# Patient Record
Sex: Female | Born: 1975 | Race: Black or African American | Hispanic: No | Marital: Married | Smoking: Never smoker
Health system: Southern US, Community
[De-identification: ages and names within clinical notes are randomized; demographics above are authoritative.]

## PROBLEM LIST (undated history)

## (undated) DIAGNOSIS — A6000 Herpesviral infection of urogenital system, unspecified: Secondary | ICD-10-CM

## (undated) HISTORY — PX: WISDOM TOOTH EXTRACTION: SHX21

---
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
## 1999-07-26 ENCOUNTER — Inpatient Hospital Stay (HOSPITAL_COMMUNITY): Admission: AD | Admit: 1999-07-26 | Discharge: 1999-07-26 | Payer: Self-pay | Admitting: *Deleted

## 1999-07-26 ENCOUNTER — Encounter: Payer: Self-pay | Admitting: *Deleted

## 2012-06-23 HISTORY — PX: IUD REMOVAL: SHX5392

## 2014-05-23 DIAGNOSIS — A6000 Herpesviral infection of urogenital system, unspecified: Secondary | ICD-10-CM

## 2014-05-23 HISTORY — DX: Herpesviral infection of urogenital system, unspecified: A60.00

## 2016-07-29 DIAGNOSIS — Z01419 Encounter for gynecological examination (general) (routine) without abnormal findings: Secondary | ICD-10-CM | POA: Diagnosis not present

## 2017-11-18 DIAGNOSIS — K0889 Other specified disorders of teeth and supporting structures: Secondary | ICD-10-CM | POA: Diagnosis not present

## 2017-11-18 DIAGNOSIS — K047 Periapical abscess without sinus: Secondary | ICD-10-CM | POA: Diagnosis not present

## 2018-01-04 ENCOUNTER — Telehealth: Payer: Self-pay | Admitting: Obstetrics & Gynecology

## 2018-01-04 ENCOUNTER — Encounter: Payer: Self-pay | Admitting: Obstetrics & Gynecology

## 2018-01-04 ENCOUNTER — Ambulatory Visit (INDEPENDENT_AMBULATORY_CARE_PROVIDER_SITE_OTHER): Payer: BLUE CROSS/BLUE SHIELD | Admitting: Obstetrics & Gynecology

## 2018-01-04 ENCOUNTER — Other Ambulatory Visit (HOSPITAL_COMMUNITY)
Admission: RE | Admit: 2018-01-04 | Discharge: 2018-01-04 | Disposition: A | Discharge status: Home / Self Care | Payer: BLUE CROSS/BLUE SHIELD | Source: Ambulatory Visit | Attending: Obstetrics & Gynecology | Admitting: Obstetrics & Gynecology

## 2018-01-04 VITALS — BP 118/74 | HR 80 | Ht 67.0 in | Wt 220.0 lb

## 2018-01-04 DIAGNOSIS — Z124 Encounter for screening for malignant neoplasm of cervix: Secondary | ICD-10-CM

## 2018-01-04 DIAGNOSIS — Z0000 Encounter for general adult medical examination without abnormal findings: Secondary | ICD-10-CM | POA: Diagnosis not present

## 2018-01-04 DIAGNOSIS — Z1231 Encounter for screening mammogram for malignant neoplasm of breast: Secondary | ICD-10-CM | POA: Diagnosis not present

## 2018-01-04 DIAGNOSIS — Z30431 Encounter for routine checking of intrauterine contraceptive device: Secondary | ICD-10-CM | POA: Diagnosis not present

## 2018-01-04 DIAGNOSIS — Z1239 Encounter for other screening for malignant neoplasm of breast: Secondary | ICD-10-CM

## 2018-01-04 NOTE — Telephone Encounter (Signed)
7/26 AT 940 FOR MIRENA WITH [facility]

## 2018-01-04 NOTE — Progress Notes (Signed)
HPI:      Julia Graham is a 42 y.o. [phone] who LMP was Patient's last menstrual period was 12/30/2017 (exact date)., she presents today for her annual examination. The patient has no complaints today. Prior PAP, uncertain date. The patient is sexually active. Her no prior history of gyn screening tests. The patient does perform self breast exams.  There is no notable family history of breast or ovarian cancer in her family.  The patient has regular exercise: yes.  The patient denies current symptoms of depression.    GYN History: Contraception: IUD  PMHx: History reviewed. No pertinent past medical history. Past Surgical History:  Procedure Laterality Date  . IUD REMOVAL  2014  . WISDOM TOOTH EXTRACTION     History reviewed. No pertinent family history. Social History   Tobacco Use  . Smoking status: Never Smoker  . Smokeless tobacco: Never Used  Substance Use Topics  . Alcohol use: Never    Frequency: Never  . Drug use: Never    Current Outpatient Medications:  .  levonorgestrel (MIRENA) 20 MCG/24HR IUD, 1 each by Intrauterine route once., Disp: , Rfl:  Allergies: Patient has no known allergies.  Review of Systems  Constitutional: Negative for chills, fever and malaise/fatigue.  HENT: Negative for congestion, sinus pain and sore throat.   Eyes: Negative for blurred vision and pain.  Respiratory: Negative for cough and wheezing.   Cardiovascular: Negative for chest pain and leg swelling.  Gastrointestinal: Negative for abdominal pain, constipation, diarrhea, heartburn, nausea and vomiting.  Genitourinary: Negative for dysuria, frequency, hematuria and urgency.  Musculoskeletal: Negative for back pain, joint pain, myalgias and neck pain.  Skin: Negative for itching and rash.  Neurological: Negative for dizziness, tremors and weakness.  Endo/Heme/Allergies: Does not bruise/bleed easily.  Psychiatric/Behavioral: Negative for depression. The patient is not  nervous/anxious and does not have insomnia.    Objective: BP 118/74 (BP Location: Right Arm, Patient Position: Sitting, Cuff Size: Large)   Pulse 80   Ht 5\' 7"  (1.702 m)   Wt 220 lb (99.8 kg)   LMP 12/30/2017 (Exact Date)   BMI 34.46 kg/m   Filed Weights   01/04/18 0942  Weight: 220 lb (99.8 kg)   Body mass index is 34.46 kg/m. Physical Exam  Constitutional: She is oriented to person, place, and time. She appears well-developed and well-nourished. No distress.  Genitourinary: Rectum normal, vagina normal and uterus normal. Pelvic exam was performed with patient supine. There is no rash or lesion on the right labia. There is no rash or lesion on the left labia. Vagina exhibits no lesion. No bleeding in the vagina. Right adnexum does not display mass and does not display tenderness. Left adnexum does not display mass and does not display tenderness. Cervix does not exhibit motion tenderness, lesion, friability or polyp.   Uterus is mobile and midaxial. Uterus is not enlarged or exhibiting a mass.  Genitourinary Comments: IUD strings not seen or felt  HENT:  Head: Normocephalic and atraumatic. Head is without laceration.  Right Ear: Hearing normal.  Left Ear: Hearing normal.  Nose: No epistaxis.  No foreign bodies.  Mouth/Throat: Uvula is midline, oropharynx is clear and moist and mucous membranes are normal.  Eyes: Pupils are equal, round, and reactive to light.  Neck: Normal range of motion. Neck supple. No thyromegaly present.  Cardiovascular: Normal rate and regular rhythm. Exam reveals no gallop and no friction rub.  No murmur heard. Pulmonary/Chest: Effort normal and breath sounds  normal. No respiratory distress. She has no wheezes. Right breast exhibits no mass, no skin change and no tenderness. Left breast exhibits no mass, no skin change and no tenderness.  Abdominal: Soft. Bowel sounds are normal. She exhibits no distension. There is no tenderness. There is no rebound.    Musculoskeletal: Normal range of motion.  Neurological: She is alert and oriented to person, place, and time. No cranial nerve deficit.  Skin: Skin is warm and dry.  Psychiatric: She has a normal mood and affect. Judgment normal.  Vitals reviewed.  Assessment:  ANNUAL EXAM 1. Annual physical exam   2. Screening for cervical cancer   3. Screening for breast cancer    Screening Plan:            1.  Cervical Screening-  Pap smear done today  2. Breast screening- Exam annually and mammogram>40 planned   3. Colonoscopy every 10 years- after age 58  4. Labs managed by PCP  5. Counseling for contraception: IUD  Needs exchange soon [place] first as cannot see or feel string    F/U  Return in about 1 year (around 01/05/2019) for Annual.  Barnett Applebaum, MD, [facility], [facility] 01/04/2018  9:54 AM

## 2018-01-04 NOTE — Patient Instructions (Addendum)
PAP every three years Mammogram every year    Call [phone] to schedule at [facility] yearly (with PCP)  Levonorgestrel intrauterine device (IUD) What is this medicine? LEVONORGESTREL IUD (LEE voe nor jes trel) is a contraceptive (birth control) device. The device is placed inside the uterus by a healthcare professional. It is used to prevent pregnancy. This device can also be used to treat heavy bleeding that occurs during your period. This medicine may be used for other purposes; ask your health care provider or pharmacist if you have questions. COMMON BRAND NAME(S): Minette Headland What should I tell my health care provider before I take this medicine? They need to know if you have any of these conditions: -abnormal Pap smear -cancer of the breast, uterus, or cervix -diabetes -endometritis -genital or pelvic infection now or in the past -have more than one sexual partner or your partner has more than one partner -heart disease -history of an ectopic or tubal pregnancy -immune system problems -IUD in place -liver disease or tumor -problems with blood clots or take blood-thinners -seizures -use intravenous drugs -uterus of unusual shape -vaginal bleeding that has not been explained -an unusual or allergic reaction to levonorgestrel, other hormones, silicone, or polyethylene, medicines, foods, dyes, or preservatives -pregnant or trying to get pregnant -breast-feeding How should I use this medicine? This device is placed inside the uterus by a health care professional. Talk to your pediatrician regarding the use of this medicine in children. Special care may be needed. Overdosage: If you think you have taken too much of this medicine contact a poison control center or emergency room at once. NOTE: This medicine is only for you. Do not share this medicine with others. What if I miss a dose? This does not apply. Depending on the brand of device you have  inserted, the device will need to be replaced every 3 to 5 years if you wish to continue using this type of birth control. What may interact with this medicine? Do not take this medicine with any of the following medications: -amprenavir -bosentan -fosamprenavir This medicine may also interact with the following medications: -aprepitant -armodafinil -barbiturate medicines for inducing sleep or treating seizures -bexarotene -boceprevir -griseofulvin -medicines to treat seizures like carbamazepine, ethotoin, felbamate, oxcarbazepine, phenytoin, topiramate -modafinil -pioglitazone -rifabutin -rifampin -rifapentine -some medicines to treat HIV infection like atazanavir, efavirenz, indinavir, lopinavir, nelfinavir, tipranavir, ritonavir -St. John's wort -warfarin This list may not describe all possible interactions. Give your health care provider a list of all the medicines, herbs, non-prescription drugs, or dietary supplements you use. Also tell them if you smoke, drink alcohol, or use illegal drugs. Some items may interact with your medicine. What should I watch for while using this medicine? Visit your doctor or health care professional for regular check ups. See your doctor if you or your partner has sexual contact with others, becomes HIV positive, or gets a sexual transmitted disease. This product does not protect you against HIV infection (AIDS) or other sexually transmitted diseases. You can check the placement of the IUD yourself by reaching up to the top of your vagina with clean fingers to feel the threads. Do not pull on the threads. It is a good habit to check placement after each menstrual period. Call your doctor right away if you feel more of the IUD than just the threads or if you cannot feel the threads at all. The IUD may come out by itself. You may become pregnant if the device comes  out. If you notice that the IUD has come out use a backup birth control method like condoms  and call your health care provider. Using tampons will not change the position of the IUD and are okay to use during your period. This IUD can be safely scanned with magnetic resonance imaging (MRI) only under specific conditions. Before you have an MRI, tell your healthcare provider that you have an IUD in place, and which type of IUD you have in place. What side effects may I notice from receiving this medicine? Side effects that you should report to your doctor or health care professional as soon as possible: -allergic reactions like skin rash, itching or hives, swelling of the face, lips, or tongue -fever, flu-like symptoms -genital sores -high blood pressure -no menstrual period for 6 weeks during use -pain, swelling, warmth in the leg -pelvic pain or tenderness -severe or sudden headache -signs of pregnancy -stomach cramping -sudden shortness of breath -trouble with balance, talking, or walking -unusual vaginal bleeding, discharge -yellowing of the eyes or skin Side effects that usually do not require medical attention (report to your doctor or health care professional if they continue or are bothersome): -acne -breast pain -change in sex drive or performance -changes in weight -cramping, dizziness, or faintness while the device is being inserted -headache -irregular menstrual bleeding within first 3 to 6 months of use -nausea This list may not describe all possible side effects. Call your doctor for medical advice about side effects. You may report side effects to FDA at 1-800-FDA-1088. Where should I keep my medicine? This does not apply. NOTE: This sheet is a summary. It may not cover all possible information. If you have questions about this medicine, talk to your doctor, pharmacist, or health care provider.  2018 Elsevier/Gold Standard (2016-03-21 14:14:56)

## 2018-01-05 LAB — CYTOLOGY - PAP
Diagnosis: NEGATIVE
HPV: NOT DETECTED

## 2018-01-12 NOTE — Telephone Encounter (Signed)
Mirena reserved for this patient.

## 2018-01-15 ENCOUNTER — Ambulatory Visit (INDEPENDENT_AMBULATORY_CARE_PROVIDER_SITE_OTHER): Payer: BLUE CROSS/BLUE SHIELD

## 2018-01-15 ENCOUNTER — Encounter: Payer: Self-pay | Admitting: Obstetrics & Gynecology

## 2018-01-15 ENCOUNTER — Ambulatory Visit (INDEPENDENT_AMBULATORY_CARE_PROVIDER_SITE_OTHER): Payer: BLUE CROSS/BLUE SHIELD | Admitting: Obstetrics & Gynecology

## 2018-01-15 VITALS — BP 120/80 | Wt 219.0 lb

## 2018-01-15 DIAGNOSIS — D251 Intramural leiomyoma of uterus: Secondary | ICD-10-CM

## 2018-01-15 DIAGNOSIS — Z30433 Encounter for removal and reinsertion of intrauterine contraceptive device: Secondary | ICD-10-CM | POA: Diagnosis not present

## 2018-01-15 DIAGNOSIS — Z30431 Encounter for routine checking of intrauterine contraceptive device: Secondary | ICD-10-CM

## 2018-01-15 DIAGNOSIS — Z8742 Personal history of other diseases of the female genital tract: Secondary | ICD-10-CM

## 2018-01-15 NOTE — Progress Notes (Signed)
  History of Present Illness:  Julia Graham is a 42 y.o. that had a Mirena IUD placed approximately 5 years ago. Since that time, she states that she has improved menorrhagia w min-no periods.  The following portions of the patient's history were reviewed and updated as appropriate: allergies, current medications, past family history, past medical history, past social history, past surgical history and problem list.  There are no active problems to display for this patient.  Medications:  Current Outpatient Medications on File Prior to Visit  Medication Sig Dispense Refill  . levonorgestrel (MIRENA) 20 MCG/24HR IUD 1 each by Intrauterine route once.     No current facility-administered medications on file prior to visit.    Allergies: has No Known Allergies.  Physical Exam:  BP 120/80   Wt 219 lb (99.3 kg)   LMP 12/30/2017 (Exact Date)   BMI 34.30 kg/m  Body mass index is 34.3 kg/m. Constitutional: Well nourished, well developed female in no acute distress.  Abdomen: diffusely non tender to palpation, non distended, and no masses, hernias Neuro: Grossly intact Psych:  Normal mood and affect.    Pelvic exam:  Two IUD strings present seen coming from the cervical os. EGBUS, vaginal vault and cervix: within normal limits  IUD Removal Strings of IUD identified and grasped.  IUD removed without problem.  Pt tolerated this well.  IUD noted to be intact.  Assessment: IUD Removal and Re-insertion for Contraception and Menorrhagia control  IUD PROCEDURE NOTE:  Julia Graham is a 42 y.o. [phone] here for IUD insertion. No GYN concerns.  Last pap smear was normal.  IUD Insertion Procedure Note Patient identified, informed consent performed, consent signed.   Discussed risks of irregular bleeding, cramping, infection, malpositioning or misplacement of the IUD outside the uterus which may require further procedure such as laparoscopy, risk of failure <1%. Time out was  performed.  Urine pregnancy test negative.  A bimanual exam showed the uterus to be midposition.  Speculum placed in the vagina.  Cervix visualized.  Cleaned with Betadine x 2.  Grasped anteriorly with a single tooth tenaculum.  Uterus sounded to 7 cm.   IUD placed per manufacturer's recommendations.  Strings trimmed to 3 cm. Tenaculum was removed, good hemostasis noted.  Patient tolerated procedure well.   Patient was given post-procedure instructions.  She was advised to have backup contraception for one week.  Patient was also asked to check IUD strings periodically and follow up in 4 weeks for IUD check.  Barnett Applebaum, MD, [facility], [facility] 01/15/2018  10:29 AM

## 2018-02-12 ENCOUNTER — Encounter: Payer: Self-pay | Admitting: Obstetrics & Gynecology

## 2018-02-12 ENCOUNTER — Ambulatory Visit (INDEPENDENT_AMBULATORY_CARE_PROVIDER_SITE_OTHER): Payer: BLUE CROSS/BLUE SHIELD | Admitting: Obstetrics & Gynecology

## 2018-02-12 VITALS — BP 120/80 | Ht 67.0 in | Wt 203.0 lb

## 2018-02-12 DIAGNOSIS — Z30431 Encounter for routine checking of intrauterine contraceptive device: Secondary | ICD-10-CM

## 2018-02-12 NOTE — Progress Notes (Signed)
  History of Present Illness:  Julia Graham is a 42 y.o. that had a Mirena IUD placed approximately 4 weeks ago. Since that time, she states that she has had no discharge and pain. She had some brief BTB after placement, none now.  PMHx: She  has no past medical history on file. Also,  has a past surgical history that includes IUD removal (2014) and Wisdom tooth extraction., family history is not on file.,  reports that she has never smoked. She has never used smokeless tobacco. She reports that she does not drink alcohol or use drugs. No outpatient medications have been marked as taking for the 02/12/18 encounter (Office Visit) with Gae Dry, MD.  .  Also, has No Known Allergies..  Review of Systems  All other systems reviewed and are negative.  Physical Exam:  BP 120/80   Ht 5\' 7"  (1.702 m)   Wt 203 lb (92.1 kg)   LMP 01/25/2018   BMI 31.79 kg/m  Body mass index is 31.79 kg/m. Constitutional: Well nourished, well developed female in no acute distress.  Abdomen: diffusely non tender to palpation, non distended, and no masses, hernias Neuro: Grossly intact Psych:  Normal mood and affect.    Pelvic exam:  Two IUD strings present seen coming from the cervical os. EGBUS, vaginal vault and cervix: within normal limits  Assessment: IUD strings present in proper location; pt doing well  Plan: She was told to continue to use barrier contraception, in order to prevent any STIs, and to take a home pregnancy test or call us if she ever thinks she may be pregnant, and that her IUD expires in 5 years.  She was amenable to this plan and we will see her back in 1 year/PRN.  A total of 15 minutes were spent face-to-face with the patient during this encounter and over half of that time dealt with counseling and coordination of care.  Barnett Applebaum, MD, [facility], [facility] 02/12/2018  10:03 AM

## 2018-04-14 ENCOUNTER — Telehealth: Payer: Self-pay

## 2018-04-14 NOTE — Telephone Encounter (Signed)
Left message to remind pt to get mammogram

## 2018-04-14 NOTE — Telephone Encounter (Signed)
-----   Message from Gae Dry, MD sent at 04/13/2018  7:47 AM EDT ----- Regarding: MMG Received notice she has not received MMG yet as ordered at her Annual. Please check and encourage her to do this, and document conversation.

## 2018-04-27 DIAGNOSIS — B373 Candidiasis of vulva and vagina: Secondary | ICD-10-CM | POA: Diagnosis not present

## 2019-03-01 ENCOUNTER — Ambulatory Visit (INDEPENDENT_AMBULATORY_CARE_PROVIDER_SITE_OTHER): Payer: 59 | Admitting: Obstetrics and Gynecology

## 2019-03-01 ENCOUNTER — Other Ambulatory Visit: Payer: Self-pay

## 2019-03-01 ENCOUNTER — Encounter: Payer: Self-pay | Admitting: Obstetrics and Gynecology

## 2019-03-01 VITALS — BP 120/70 | Ht 67.5 in | Wt 227.0 lb

## 2019-03-01 DIAGNOSIS — A6004 Herpesviral vulvovaginitis: Secondary | ICD-10-CM | POA: Diagnosis not present

## 2019-03-01 MED ORDER — VALACYCLOVIR HCL 500 MG PO TABS: 500.0000 mg | ORAL_TABLET | Freq: Two times a day (BID) | ORAL | 0 refills | Status: AC

## 2019-03-01 NOTE — Patient Instructions (Signed)
I value your feedback and entrusting us with your care. If you get a [facility] patient survey, I would appreciate you taking the time to let us know about your experience today. Thank you! 

## 2019-03-01 NOTE — Progress Notes (Signed)
Patient, No Pcp Per   Chief Complaint  Patient presents with  . Vaginal Exam    pt thinks she's having herpes outbreak    HPI:      [name] is a 43 y.o. [phone] who LMP was Patient's last menstrual period was 02/15/2019 (approximate)., presents today for vaginal lesions for several days. 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 No recurrent sx until this time. No vag d/c, odor. Treating with anti-itch cream. No new sex partners.   Mirena placed 01/15/18 Has annual 9/20  Past Medical History:  Diagnosis Date  . 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 IUD REMOVAL  2014  . WISDOM TOOTH EXTRACTION      History reviewed. No pertinent family history.  Social History   Socioeconomic History  . Marital status: Single    Spouse name: Not on file  . Number of children: Not on file  . Years of education: Not on file  . Highest education level: Not on file  Occupational History  . Not on file  Social Needs  . Financial resource strain: Not on file  . Food insecurity    Worry: Not on file    Inability: Not on file  . Transportation needs    Medical: Not on file    Non-medical: Not on file  Tobacco Use  . Smoking status: Never Smoker  . Smokeless tobacco: Never Used  Substance and Sexual Activity  . Alcohol use: Never    Frequency: Never  . Drug use: Never  . Sexual activity: Yes    Partners: Male    Birth control/protection: I.U.D.    Comment: Mirena  Lifestyle  . Physical activity    Days per week: 0 days    Minutes per session: 0 min  . Stress: Not on file  Relationships  . Social Herbalist on phone: Not on file    Gets together: Not on file    Attends religious service: Not on file    Active member of club or organization: Not on file    Attends meetings of clubs or organizations: Not on file    Relationship  status: Not on file  . Intimate partner violence    Fear of current or ex partner: Not on file    Emotionally abused: Not on file    Physically abused: Not on file    Forced sexual activity: Not on file  Other Topics Concern  . Not on file  Social History Narrative  . Not on file    Outpatient Medications Prior to Visit  Medication Sig Dispense Refill  . levonorgestrel (MIRENA) 20 MCG/24HR IUD 1 each by Intrauterine route once.     No facility-administered medications prior to visit.       ROS:  Review of Systems  Constitutional: Negative for fever.  Gastrointestinal: Negative for blood in stool, constipation, diarrhea, nausea and vomiting.  Genitourinary: Positive for genital sores. Negative for dyspareunia, dysuria, flank pain, frequency, hematuria, urgency, vaginal bleeding, vaginal discharge and vaginal pain.  Musculoskeletal: Negative for back pain.  Skin: Negative for rash.    OBJECTIVE:   Vitals:  BP 120/70   Ht 5' 7.5" (1.715 m)   Wt 227 lb (103 kg)   LMP 02/15/2019 (Approximate)   BMI 35.03 kg/m   Physical Exam Vitals  signs reviewed.  Constitutional:      Appearance: She is well-developed.  Neck:     Musculoskeletal: Normal range of motion.  Pulmonary:     Effort: Pulmonary effort is normal.  Genitourinary:    General: Normal vulva.     Pubic Area: No rash.      Labia:        Right: Lesion present. No rash or tenderness.        Left: Lesion present. No rash or tenderness.        Comments: MULT ULCERATED LESIONS BILAT LABIA MAJORA, R>L Musculoskeletal: Normal range of motion.  Skin:    General: Skin is warm and dry.  Neurological:     General: No focal deficit present.     Mental Status: She is alert and oriented to person, place, and time.  Psychiatric:        Mood and Affect: Mood normal.        Behavior: Behavior normal.        Thought Content: Thought content normal.        Judgment: Judgment normal.     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 Rx valtrex. RTO if sx cont to recur for repeat culture to confirm type 1. F/u prn.   Meds ordered this encounter  Medications  . valACYclovir (VALTREX) 500 MG tablet    Sig: Take 1 tablet (500 mg total) by mouth 2 (two) times daily for 3 days. Prn sx    Dispense:  30 tablet    Refill:  0    Order Specific Question:   Supervising Provider    Answer:   Gae Dry [id number]      Return if symptoms worsen or fail to improve.   B. , PA-C 03/01/2019 11:59 AM

## 2019-03-23 ENCOUNTER — Ambulatory Visit (INDEPENDENT_AMBULATORY_CARE_PROVIDER_SITE_OTHER): Payer: 59 | Admitting: Obstetrics & Gynecology

## 2019-03-23 ENCOUNTER — Encounter: Payer: Self-pay | Admitting: Obstetrics & Gynecology

## 2019-03-23 ENCOUNTER — Other Ambulatory Visit: Payer: Self-pay

## 2019-03-23 VITALS — BP 120/80 | Ht 67.0 in | Wt 227.0 lb

## 2019-03-23 DIAGNOSIS — Z1329 Encounter for screening for other suspected endocrine disorder: Secondary | ICD-10-CM

## 2019-03-23 DIAGNOSIS — Z131 Encounter for screening for diabetes mellitus: Secondary | ICD-10-CM

## 2019-03-23 DIAGNOSIS — Z01419 Encounter for gynecological examination (general) (routine) without abnormal findings: Secondary | ICD-10-CM | POA: Diagnosis not present

## 2019-03-23 DIAGNOSIS — Z1239 Encounter for other screening for malignant neoplasm of breast: Secondary | ICD-10-CM

## 2019-03-23 DIAGNOSIS — Z13220 Encounter for screening for lipoid disorders: Secondary | ICD-10-CM

## 2019-03-23 NOTE — Progress Notes (Signed)
HPI:      [name] is a 43 y.o. [phone] who LMP was Patient's last menstrual period was 03/13/2019., she presents today for her annual examination. The patient has no complaints today. The patient is sexually active. Her last pap: approximate date 2019 and was normal and last mammogram: patient has never had a mammogram. The patient does perform self breast exams.  There is no notable family history of breast or ovarian cancer in her family.  The patient has regular exercise: yes.  The patient denies current symptoms of depression.    GYN History: Contraception: IUD  PMHx: Past Medical History:  Diagnosis Date  . 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 IUD REMOVAL  2014  . WISDOM TOOTH EXTRACTION     History reviewed. No pertinent family history. Social History   Tobacco Use  . Smoking status: Never Smoker  . Smokeless tobacco: Never Used  Substance Use Topics  . Alcohol use: Never    Frequency: Never  . Drug use: Never    Current Outpatient Medications:  .  levonorgestrel (MIRENA) 20 MCG/24HR IUD, 1 each by Intrauterine route once., Disp: , Rfl:  Allergies: Patient has no known allergies.  Review of Systems  Constitutional: Negative for chills, fever and malaise/fatigue.  HENT: Negative for congestion, sinus pain and sore throat.   Eyes: Negative for blurred vision and pain.  Respiratory: Negative for cough and wheezing.   Cardiovascular: Negative for chest pain and leg swelling.  Gastrointestinal: Negative for abdominal pain, constipation, diarrhea, heartburn, nausea and vomiting.  Genitourinary: Negative for dysuria, frequency, hematuria and urgency.  Musculoskeletal: Negative for back pain, joint pain, myalgias and neck pain.  Skin: Negative for itching and rash.  Neurological: Negative for dizziness, tremors and weakness.  Endo/Heme/Allergies: Does not bruise/bleed easily.   Psychiatric/Behavioral: Negative for depression. The patient is not nervous/anxious and does not have insomnia.     Objective: BP 120/80   Ht 5\' 7"  (1.702 m)   Wt 227 lb (103 kg)   LMP 03/13/2019   BMI 35.55 kg/m   Filed Weights   03/23/19 0836  Weight: 227 lb (103 kg)   Body mass index is 35.55 kg/m. Physical Exam Constitutional:      General: She is not in acute distress.    Appearance: She is well-developed.  Genitourinary:     Pelvic exam was performed with patient supine.     Vagina, uterus and rectum normal.     No lesions in the vagina.     No vaginal bleeding.     No cervical motion tenderness, friability, lesion or polyp.     IUD strings visualized.     Uterus is mobile.     Uterus is not enlarged.     No uterine mass detected.    Uterus is midaxial.     No right or left adnexal mass present.     Right adnexa not tender.     Left adnexa not tender.     Genitourinary Comments: No lesions  HENT:     Head: Normocephalic and atraumatic. No laceration.     Right Ear: Hearing normal.     Left Ear: Hearing normal.     Mouth/Throat:     Pharynx: Uvula midline.  Eyes:     Pupils: Pupils are equal, round, and reactive to light.  Neck:     Musculoskeletal: Normal range of motion  and neck supple.     Thyroid: No thyromegaly.  Cardiovascular:     Rate and Rhythm: Normal rate and regular rhythm.     Heart sounds: No murmur. No friction rub. No gallop.   Pulmonary:     Effort: Pulmonary effort is normal. No respiratory distress.     Breath sounds: Normal breath sounds. No wheezing.  Chest:     Breasts:        Right: No mass, skin change or tenderness.        Left: No mass, skin change or tenderness.  Abdominal:     General: Bowel sounds are normal. There is no distension.     Palpations: Abdomen is soft.     Tenderness: There is no abdominal tenderness. There is no rebound.  Musculoskeletal: Normal range of motion.  Neurological:     Mental Status: She is alert  and oriented to person, place, and time.     Cranial Nerves: No cranial nerve deficit.  Skin:    General: Skin is warm and dry.  Psychiatric:        Judgment: Judgment normal.  Vitals signs reviewed.     Assessment:  ANNUAL EXAM 1. Women's annual routine gynecological examination   2. Screening for breast cancer   3. Screening for thyroid disorder   4. Screening for diabetes mellitus   5. Screening for cholesterol level      Screening Plan:            1.  Cervical Screening-  Pap smear schedule reviewed with patient  2. Breast screening- Exam annually and mammogram>40 planned   3. Colonoscopy every 10 years, Hemoccult testing - after age 42  4. Labs Ordered today  5. Counseling for contraception: IUD  Year 1 after replacement, going well  6. HSV in past, Valtrex helped. Does not desire suppressive therapy at this time    F/U  Return in about 1 year (around 03/22/2020) for Annual.  Barnett Applebaum, MD, [facility], [facility] 03/23/2019  9:20 AM

## 2019-03-23 NOTE — Patient Instructions (Addendum)
PAP every three years Mammogram every year    Call [phone] to schedule at [facility] today  

## 2019-03-24 ENCOUNTER — Encounter: Payer: Self-pay | Admitting: Obstetrics & Gynecology

## 2019-03-24 LAB — LIPID PANEL
Chol/HDL Ratio: 3.3 ratio (ref 0.0–4.4)
Cholesterol, Total: 154 mg/dL (ref 100–199)
HDL: 46 mg/dL (ref >39)
LDL Chol Calc (NIH): 95 mg/dL (ref 0–99)
Triglycerides: 64 mg/dL (ref 0–149)
VLDL Cholesterol Cal: 13 mg/dL (ref 5–40)

## 2019-03-24 LAB — TSH: TSH: 1.47 u[IU]/mL (ref 0.450–4.500)

## 2019-03-24 LAB — GLUCOSE, FASTING: Glucose, Plasma: 89 mg/dL (ref 65–99)

## 2019-04-15 ENCOUNTER — Other Ambulatory Visit: Payer: Self-pay

## 2019-04-15 ENCOUNTER — Ambulatory Visit
Admission: RE | Admit: 2019-04-15 | Discharge: 2019-04-15 | Disposition: A | Discharge status: Home / Self Care | Payer: 59 | Source: Ambulatory Visit | Attending: Obstetrics & Gynecology | Admitting: Obstetrics & Gynecology

## 2019-04-15 DIAGNOSIS — Z1231 Encounter for screening mammogram for malignant neoplasm of breast: Secondary | ICD-10-CM | POA: Diagnosis not present

## 2019-04-15 DIAGNOSIS — Z1239 Encounter for other screening for malignant neoplasm of breast: Secondary | ICD-10-CM

## 2019-04-15 DIAGNOSIS — Z20822 Contact with and (suspected) exposure to covid-19: Secondary | ICD-10-CM

## 2019-04-17 LAB — NOVEL CORONAVIRUS, NAA: SARS-CoV-2, NAA: NOT DETECTED

## 2019-04-18 ENCOUNTER — Encounter: Payer: Self-pay | Admitting: Obstetrics & Gynecology

## 2019-08-09 NOTE — Telephone Encounter (Signed)
Mirena rcvd/charged 01/15/18

## 2019-10-06 ENCOUNTER — Other Ambulatory Visit: Payer: Self-pay

## 2019-10-06 ENCOUNTER — Ambulatory Visit: Payer: Self-pay | Attending: Internal Medicine

## 2019-10-06 DIAGNOSIS — Z23 Encounter for immunization: Secondary | ICD-10-CM

## 2019-10-06 NOTE — Progress Notes (Signed)
   Covid-19 Vaccination Clinic  Name:  Julia Graham    MRN: [id number] DOB: December 08, 1975  10/06/2019  [name] was observed post Covid-19 immunization for 15 minutes without incident. She was provided with Vaccine Information Sheet and instruction to access the V-Safe system.   [name] was instructed to call 911 with any severe reactions post vaccine: Marland Kitchen Difficulty breathing  . Swelling of face and throat  . A fast heartbeat  . A bad rash all over body  . Dizziness and weakness   Immunizations Administered    Name Date Dose VIS Date Route   Pfizer COVID-19 Vaccine 10/06/2019  8:05 AM 0.3 mL 06/03/2019 Intramuscular   Manufacturer: [employer], [employer]   Lot: [id number]   [place]: [id number]

## 2019-11-01 ENCOUNTER — Ambulatory Visit: Payer: Self-pay | Attending: Internal Medicine

## 2019-11-01 DIAGNOSIS — Z23 Encounter for immunization: Secondary | ICD-10-CM

## 2019-11-01 NOTE — Progress Notes (Signed)
   Covid-19 Vaccination Clinic  Name:  Julia Graham    MRN: [id number] DOB: [birth year]  11/01/2019  Julia Graham was observed post Covid-19 immunization for 15 minutes without incident. She was provided with Vaccine Information Sheet and instruction to access the V-Safe system.   Julia Graham was instructed to call 911 with any severe reactions post vaccine: Marland Kitchen Difficulty breathing  . Swelling of face and throat  . A fast heartbeat  . A bad rash all over body  . Dizziness and weakness   Immunizations Administered    Name Date Dose VIS Date Route   Pfizer COVID-19 Vaccine 11/01/2019  8:13 AM 0.3 mL 08/17/2018 Intramuscular   Manufacturer: [place]   Lot: [id number]   [place]: [id number]

## 2020-04-02 ENCOUNTER — Ambulatory Visit (INDEPENDENT_AMBULATORY_CARE_PROVIDER_SITE_OTHER): Payer: BC Managed Care – PPO | Admitting: Obstetrics & Gynecology

## 2020-04-02 ENCOUNTER — Other Ambulatory Visit: Payer: Self-pay

## 2020-04-02 ENCOUNTER — Encounter: Payer: Self-pay | Admitting: Obstetrics & Gynecology

## 2020-04-02 VITALS — BP 120/80 | Ht 68.5 in | Wt 210.0 lb

## 2020-04-02 DIAGNOSIS — Z1231 Encounter for screening mammogram for malignant neoplasm of breast: Secondary | ICD-10-CM | POA: Diagnosis not present

## 2020-04-02 DIAGNOSIS — Z01419 Encounter for gynecological examination (general) (routine) without abnormal findings: Secondary | ICD-10-CM

## 2020-04-02 NOTE — Progress Notes (Signed)
HPI:      [name] is a 44 y.o. [phone] who LMP was Patient's last menstrual period was 03/30/2020., she presents today for her annual examination. The patient has no complaints today. The patient is sexually active. Her last pap: approximate date 2019 and was normal and last mammogram: approximate date 2020 and was normal. The patient does perform self breast exams.  There is no notable family history of breast or ovarian cancer in her family.  The patient has regular exercise: yes.  The patient denies current symptoms of depression.    GYN History: Contraception: IUD  PMHx: Past Medical History:  Diagnosis Date  . 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 IUD REMOVAL  2014  . WISDOM TOOTH EXTRACTION     Family History  Problem Relation Age of Onset  . Breast cancer Neg Hx    Social History   Tobacco Use  . Smoking status: Never Smoker  . Smokeless tobacco: Never Used  Vaping Use  . Vaping Use: Never used  Substance Use Topics  . Alcohol use: Never  . Drug use: Never    Current Outpatient Medications:  .  levonorgestrel (MIRENA) 20 MCG/24HR IUD, 1 each by Intrauterine route once., Disp: , Rfl:  Allergies: Patient has no known allergies.  Review of Systems  Constitutional: Negative for chills, fever and malaise/fatigue.  HENT: Negative for congestion, sinus pain and sore throat.   Eyes: Negative for blurred vision and pain.  Respiratory: Negative for cough and wheezing.   Cardiovascular: Negative for chest pain and leg swelling.  Gastrointestinal: Negative for abdominal pain, constipation, diarrhea, heartburn, nausea and vomiting.  Genitourinary: Negative for dysuria, frequency, hematuria and urgency.  Musculoskeletal: Negative for back pain, joint pain, myalgias and neck pain.  Skin: Negative for itching and rash.  Neurological: Negative for dizziness, tremors and weakness.    Endo/Heme/Allergies: Does not bruise/bleed easily.  Psychiatric/Behavioral: Negative for depression. The patient is not nervous/anxious and does not have insomnia.     Objective: BP 120/80   Ht 5' 8.5" (1.74 m)   Wt 210 lb (95.3 kg)   LMP 03/30/2020   BMI 31.47 kg/m   Filed Weights   04/02/20 0916  Weight: 210 lb (95.3 kg)   Body mass index is 31.47 kg/m. Physical Exam Constitutional:      General: She is not in acute distress.    Appearance: She is well-developed.  Genitourinary:     Pelvic exam was performed with patient supine.     Vagina, uterus and rectum normal.     No lesions in the vagina.     No vaginal bleeding.     No cervical motion tenderness, friability, lesion or polyp.     IUD strings visualized.     Uterus is mobile.     Uterus is not enlarged.     No uterine mass detected.    Uterus is midaxial.     No right or left adnexal mass present.     Right adnexa not tender.     Left adnexa not tender.  HENT:     Head: Normocephalic and atraumatic. No laceration.     Right Ear: Hearing normal.     Left Ear: Hearing normal.     Mouth/Throat:     Pharynx: Uvula midline.  Eyes:     Pupils: Pupils are equal, round, and reactive to light.  Neck:  Thyroid: No thyromegaly.  Cardiovascular:     Rate and Rhythm: Normal rate and regular rhythm.     Heart sounds: No murmur heard.  No friction rub. No gallop.   Pulmonary:     Effort: Pulmonary effort is normal. No respiratory distress.     Breath sounds: Normal breath sounds. No wheezing.  Chest:     Breasts:        Right: No mass, skin change or tenderness.        Left: No mass, skin change or tenderness.  Abdominal:     General: Bowel sounds are normal. There is no distension.     Palpations: Abdomen is soft.     Tenderness: There is no abdominal tenderness. There is no rebound.  Musculoskeletal:        General: Normal range of motion.     Cervical back: Normal range of motion and neck supple.   Neurological:     Mental Status: She is alert and oriented to person, place, and time.     Cranial Nerves: No cranial nerve deficit.  Skin:    General: Skin is warm and dry.  Psychiatric:        Judgment: Judgment normal.  Vitals reviewed.     Assessment:  ANNUAL EXAM 1. Women's annual routine gynecological examination   2. Encounter for screening mammogram for malignant neoplasm of breast      Screening Plan:            1.  Cervical Screening-  Pap smear schedule reviewed with patient  2. Breast screening- Exam annually and mammogram>40 planned   3. Colonoscopy every 10 years, Hemoccult testing - after age 43  4. Labs UTD, last year normal  5. Counseling for contraception: IUD Year 2 of this her second Mirena IUD The pregnancy intention screening data noted above was reviewed. Potential methods of contraception were discussed. The patient elected to Continue with IUD or IUS.    6.  Monitor dysmenorrhea (1-2 days currently, tx w Tylenol effectively) Cont IUD as well.     F/U  Return in about 1 year (around 04/02/2021) for Annual.  Barnett Applebaum, MD, [facility], [facility] 04/02/2020  9:29 AM

## 2020-04-02 NOTE — Patient Instructions (Signed)
PAP every three years Mammogram every year    Call [phone] to schedule at [facility] Colonoscopy every 10 years Labs yearly (with PCP)  Thank you for choosing [facility]. As part of our ongoing efforts to improve patient experience, we would appreciate your feedback. Please fill out the short survey that you will receive by mail or MyChart. Your opinion is important to [place]! - Dr. Kenton Kingfisher

## 2020-06-30 ENCOUNTER — Other Ambulatory Visit: Payer: BC Managed Care – PPO

## 2020-06-30 DIAGNOSIS — Z20822 Contact with and (suspected) exposure to covid-19: Secondary | ICD-10-CM

## 2020-07-03 LAB — NOVEL CORONAVIRUS, NAA: SARS-CoV-2, NAA: NOT DETECTED

## 2020-07-31 ENCOUNTER — Other Ambulatory Visit: Payer: Self-pay | Admitting: Obstetrics & Gynecology

## 2020-07-31 DIAGNOSIS — Z1231 Encounter for screening mammogram for malignant neoplasm of breast: Secondary | ICD-10-CM

## 2020-09-20 ENCOUNTER — Encounter: Payer: Self-pay | Admitting: Obstetrics & Gynecology

## 2020-09-20 ENCOUNTER — Ambulatory Visit (INDEPENDENT_AMBULATORY_CARE_PROVIDER_SITE_OTHER): Payer: BC Managed Care – PPO | Admitting: Obstetrics & Gynecology

## 2020-09-20 ENCOUNTER — Other Ambulatory Visit: Payer: Self-pay

## 2020-09-20 VITALS — BP 120/80 | Ht 68.0 in | Wt 200.0 lb

## 2020-09-20 DIAGNOSIS — K649 Unspecified hemorrhoids: Secondary | ICD-10-CM | POA: Diagnosis not present

## 2020-09-20 DIAGNOSIS — Z1231 Encounter for screening mammogram for malignant neoplasm of breast: Secondary | ICD-10-CM | POA: Diagnosis not present

## 2020-09-20 MED ORDER — HYDROCORTISONE (PERIANAL) 2.5 % EX CREA: 1.0000 "application " | TOPICAL_CREAM | Freq: Two times a day (BID) | CUTANEOUS | 0 refills | Status: DC

## 2020-09-20 NOTE — Progress Notes (Signed)
Hemorrhoids Patientis a 45 yo [id number] AA F who presents for evaluation of hemorrhoids. Patient does not have rectal bleeding   Patient is having moderate pain with bowel movements. Also itching.   Patient denies a personal history of colon cancer. Patient denies a personal history of IBD.  HAs tried prepH otc medicine w mild help.  Sx's started Sunday of this week.  Has had normal BM yesterday.  No blood in stool.  PMHx: She  has a past medical history of Genital herpes (05/2014). Also,  has a past surgical history that includes IUD removal (2014) and Wisdom tooth extraction., family history is not on file.,  reports that she has never smoked. She has never used smokeless tobacco. She reports that she does not drink alcohol and does not use drugs.  She has a current medication list which includes the following prescription(s): hydrocortisone and levonorgestrel. Also, has No Known Allergies.  Review of Systems  Constitutional: Negative for chills, fever and malaise/fatigue.  HENT: Negative for congestion, sinus pain and sore throat.   Eyes: Negative for blurred vision and pain.  Respiratory: Negative for cough and wheezing.   Cardiovascular: Negative for chest pain and leg swelling.  Gastrointestinal: Negative for abdominal pain, constipation, diarrhea, heartburn, nausea and vomiting.  Genitourinary: Negative for dysuria, frequency, hematuria and urgency.  Musculoskeletal: Negative for back pain, joint pain, myalgias and neck pain.  Skin: Negative for itching and rash.  Neurological: Negative for dizziness, tremors and weakness.  Endo/Heme/Allergies: Does not bruise/bleed easily.  Psychiatric/Behavioral: Negative for depression. The patient is not nervous/anxious and does not have insomnia.     Objective: BP 120/80   Ht 5\' 8"  (1.727 m)   Wt 200 lb (90.7 kg)   LMP 09/12/2020   BMI 30.41 kg/m  Physical Exam Constitutional:      General: She is not in acute distress.    Appearance: She  is well-developed.  Genitourinary:     Urethral meatus normal.     Right Labia: No rash or tenderness.    Left Labia: No tenderness or rash.    No vaginal erythema or bleeding.     Pelvic exam was performed with patient in the lithotomy position.  Rectum:     External hemorrhoid and internal hemorrhoid present.     Rectal exam comments: Right sided 2cm x 0.5 cm hemorrhoid both int and ext, mild T to palpation.    HENT:     Head: Normocephalic and atraumatic.     Nose: Nose normal.  Abdominal:     General: There is no distension.     Palpations: Abdomen is soft.     Tenderness: There is no abdominal tenderness.  Musculoskeletal:        General: Normal range of motion.  Neurological:     Mental Status: She is alert and oriented to person, place, and time.     Cranial Nerves: No cranial nerve deficit.  Skin:    General: Skin is warm and dry.  Psychiatric:        Attention and Perception: Attention normal.        Mood and Affect: Mood and affect normal.        Speech: Speech normal.        Behavior: Behavior normal.        Thought Content: Thought content normal.        Judgment: Judgment normal.     ASSESSMENT/PLAN:    Problem List Items Addressed This Visit  Visit Diagnoses    Hemorrhoids, unspecified hemorrhoid type    -  Primary    Rx medicine for relief and resolution Counseled on course for hemorrhoids Surgery referral only if worsens or persists long term  Barnett Applebaum, MD, [facility], [facility] 09/20/2020  12:06 PM

## 2020-09-20 NOTE — Patient Instructions (Signed)
Hemorrhoids Hemorrhoids are swollen veins that may develop:  In the butt (rectum). These are called internal hemorrhoids.  Around the opening of the butt (anus). These are called external hemorrhoids. Hemorrhoids can cause pain, itching, or bleeding. Most of the time, they do not cause serious problems. They usually get better with diet changes, lifestyle changes, and other home treatments. What are the causes? This condition may be caused by:  Having trouble pooping (constipation).  Pushing hard (straining) to poop.  Watery poop (diarrhea).  Pregnancy.  Being very overweight (obese).  Sitting for long periods of time.  Heavy lifting or other activity that causes you to strain.  Anal sex.  Riding a bike for a long period of time. What are the signs or symptoms? Symptoms of this condition include:  Pain.  Itching or soreness in the butt.  Bleeding from the butt.  Leaking poop.  Swelling in the area.  One or more lumps around the opening of your butt. How is this diagnosed? A doctor can often diagnose this condition by looking at the affected area. The doctor may also:  Do an exam that involves feeling the area with a gloved hand (digital rectal exam).  Examine the area inside your butt using a small tube (anoscope).  Order blood tests. This may be done if you have lost a lot of blood.  Have you get a test that involves looking inside the colon using a flexible tube with a camera on the end (sigmoidoscopy or colonoscopy). How is this treated? This condition can usually be treated at home. Your doctor may tell you to change what you eat, make lifestyle changes, or try home treatments. If these do not help, procedures can be done to remove the hemorrhoids or make them smaller. These may involve:  Placing rubber bands at the base of the hemorrhoids to cut off their blood supply.  Injecting medicine into the hemorrhoids to shrink them.  Shining a type of light  energy onto the hemorrhoids to cause them to fall off.  Doing surgery to remove the hemorrhoids or cut off their blood supply. Follow these instructions at home: Eating and drinking  Eat foods that have a lot of fiber in them. These include whole grains, beans, nuts, fruits, and vegetables.  Ask your doctor about taking products that have added fiber (fibersupplements).  Reduce the amount of fat in your diet. You can do this by: ? Eating low-fat dairy products. ? Eating less red meat. ? Avoiding processed foods.  Drink enough fluid to keep your pee (urine) pale yellow.   Managing pain and swelling  Take a warm-water bath (sitz bath) for 20 minutes to ease pain. Do this 3-4 times a day. You may do this in a bathtub or using a portable sitz bath that fits over the toilet.  If told, put ice on the painful area. It may be helpful to use ice between your warm baths. ? Put ice in a plastic bag. ? Place a towel between your skin and the bag. ? Leave the ice on for 20 minutes, 2-3 times a day.   General instructions  Take over-the-counter and prescription medicines only as told by your doctor. ? Medicated creams and medicines may be used as told.  Exercise often. Ask your doctor how much and what kind of exercise is best for you.  Go to the bathroom when you have the urge to poop. Do not wait.  Avoid pushing too hard when you poop.  Keep your butt dry and clean. Use wet toilet paper or moist towelettes after pooping.  Do not sit on the toilet for a long time.  Keep all follow-up visits as told by your doctor. This is important. Contact a doctor if you:  Have pain and swelling that do not get better with treatment or medicine.  Have trouble pooping.  Cannot poop.  Have pain or swelling outside the area of the hemorrhoids. Get help right away if you have:  Bleeding that will not stop. Summary  Hemorrhoids are swollen veins in the butt or around the opening of the  butt.  They can cause pain, itching, or bleeding.  Eat foods that have a lot of fiber in them. These include whole grains, beans, nuts, fruits, and vegetables.  Take a warm-water bath (sitz bath) for 20 minutes to ease pain. Do this 3-4 times a day. This information is not intended to replace advice given to you by your health care provider. Make sure you discuss any questions you have with your health care provider. Document Revised: 06/17/2018 Document Reviewed: 10/29/2017 Elsevier Patient Education  [place].

## 2020-10-19 ENCOUNTER — Inpatient Hospital Stay: Admission: RE | Admit: 2020-10-19 | Payer: BC Managed Care – PPO | Source: Ambulatory Visit

## 2021-03-05 ENCOUNTER — Other Ambulatory Visit: Payer: Self-pay | Admitting: Obstetrics & Gynecology

## 2021-03-05 DIAGNOSIS — Z1231 Encounter for screening mammogram for malignant neoplasm of breast: Secondary | ICD-10-CM

## 2021-03-19 ENCOUNTER — Other Ambulatory Visit: Payer: Self-pay

## 2021-03-19 ENCOUNTER — Ambulatory Visit
Admission: RE | Admit: 2021-03-19 | Discharge: 2021-03-19 | Disposition: A | Discharge status: Home / Self Care | Payer: BC Managed Care – PPO | Source: Ambulatory Visit | Attending: Obstetrics & Gynecology | Admitting: Obstetrics & Gynecology

## 2021-03-19 DIAGNOSIS — Z1231 Encounter for screening mammogram for malignant neoplasm of breast: Secondary | ICD-10-CM | POA: Insufficient documentation

## 2021-04-11 NOTE — Progress Notes (Signed)
PCP:  Patient, No Pcp Per (Inactive)   Chief Complaint  Patient presents with   Gynecologic Exam    Bad cramping after period ends for the last 3-4 months     HPI:      [name] is a 45 y.o. [phone] whose LMP was Patient's last menstrual period was 04/06/2021 (exact date)., presents today for her annual examination.  Her menses are regular every 28-30 days, lasting 4 days, mod flow.  Dysmenorrhea mild, occurring first 1-2 days of flow. Has worse cramping pain before her period, question mid cycle. Takes tylenol/uses heating pad with sx relief. She does not have intermenstrual bleeding.   Sex activity: single partner, contraception - IUD. 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   Last mammogram: 03/19/21 Results were: normal--routine follow-up in 12 months There is no FH of breast cancer. There is no FH of ovarian cancer. The patient does do self-breast exams.  Tobacco use: The patient denies current or previous tobacco use. Alcohol use: none No drug use.  Exercise: moderately active  She does get adequate calcium and occas Vitamin D in her diet.  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    Past Surgical History:  Procedure Laterality Date   IUD REMOVAL  2014   WISDOM TOOTH EXTRACTION      Family History  Problem Relation Age of Onset   Breast cancer Neg Hx     Social History   Socioeconomic History   Marital status: Single    Spouse name: Not on file   Number of children: Not on file   Years of education: Not on file   Highest education level: Not on file  Occupational History   Not on file  Tobacco Use   Smoking status: Never   Smokeless tobacco: Never  Vaping Use   Vaping Use: Never used  Substance and Sexual Activity   Alcohol use: Never   Drug use: Never   Sexual activity: Yes     Partners: Male    Birth control/protection: I.U.D.    Comment: Mirena  Other Topics Concern   Not on file  Social History Narrative   Not on file   Social Determinants of Health   Financial Resource Strain: Not on file  Food Insecurity: Not on file  Transportation Needs: Not on file  Physical Activity: Not on file  Stress: Not on file  Social Connections: Not on file  Intimate Partner Violence: Not on file     Current Outpatient Medications:    levonorgestrel (MIRENA) 20 MCG/24HR IUD, 1 each by Intrauterine route once., Disp: , Rfl:      ROS:  Review of Systems  Constitutional:  Negative for fatigue, fever and unexpected weight change.  Respiratory:  Negative for cough, shortness of breath and wheezing.   Cardiovascular:  Negative for chest pain, palpitations and leg swelling.  Gastrointestinal:  Negative for blood in stool, constipation, diarrhea, nausea and vomiting.  Endocrine: Negative for cold intolerance, heat intolerance and polyuria.  Genitourinary:  Negative for dyspareunia, dysuria, flank pain, frequency, genital sores, hematuria, menstrual problem, pelvic pain, urgency, vaginal bleeding, vaginal discharge and vaginal pain.  Musculoskeletal:  Negative for back pain, joint swelling and myalgias.  Skin:  Negative for rash.  Neurological:  Negative for dizziness, syncope, light-headedness, numbness and headaches.  Hematological:  Negative for adenopathy.  Psychiatric/Behavioral:  Negative for agitation, confusion, sleep disturbance and suicidal ideas. The patient is not nervous/anxious.   BREAST: No symptoms   Objective: BP 114/72   Ht 5\' 8"  (1.727 m)   Wt 210 lb (95.3 kg)   LMP 04/06/2021 (Exact Date)   BMI 31.93 kg/m    Physical Exam Constitutional:      Appearance: She is well-developed.  Genitourinary:     Vulva normal.     Right Labia: No rash, tenderness or lesions.    Left Labia: No tenderness, lesions or rash.    No vaginal discharge, erythema  or tenderness.      Right Adnexa: not tender and no mass present.    Left Adnexa: not tender and no mass present.    No cervical friability or polyp.     IUD strings visualized.     Uterus is not enlarged or tender.  Breasts:    Right: No mass, nipple discharge, skin change or tenderness.     Left: No mass, nipple discharge, skin change or tenderness.  Neck:     Thyroid: No thyromegaly.  Cardiovascular:     Rate and Rhythm: Normal rate and regular rhythm.     Heart sounds: Normal heart sounds. No murmur heard. Pulmonary:     Effort: Pulmonary effort is normal.     Breath sounds: Normal breath sounds.  Abdominal:     Palpations: Abdomen is soft.     Tenderness: There is no abdominal tenderness. There is no guarding or rebound.  Musculoskeletal:        General: Normal range of motion.     Cervical back: Normal range of motion.  Lymphadenopathy:     Cervical: No cervical adenopathy.  Neurological:     General: No focal deficit present.     Mental Status: She is alert and oriented to person, place, and time.     Cranial Nerves: No cranial nerve deficit.  Skin:    General: Skin is warm and dry.  Psychiatric:        Mood and Affect: Mood normal.        Behavior: Behavior normal.        Thought Content: Thought content normal.        Judgment: Judgment normal.  Vitals reviewed.    Assessment/Plan: Encounter for annual routine gynecological examination  Encounter for routine checking of intrauterine contraceptive device (IUD) - Plan: US PELVIS TRANSVAGINAL NON-OB (TV ONLY); IUD string in cx os. Check IUD placement with GYN u/s due to worsening non-menstrual cramping. Will f/u with results.   Encounter for screening mammogram for malignant neoplasm of breast; pt current on mammo  Dysmenorrhea - Plan: US PELVIS TRANSVAGINAL NON-OB (TV ONLY)   GYN counsel breast self exam, mammography screening, adequate intake of calcium and vitamin D, diet and exercise     F/U  Return in  about 1 week (around 04/22/2021) for GYN u/s for IUD placement--[facility] to call pt.  Julia Creary B. Mardee Clune, PA-C 04/15/2021 9:39 AM

## 2021-04-15 ENCOUNTER — Ambulatory Visit (INDEPENDENT_AMBULATORY_CARE_PROVIDER_SITE_OTHER): Payer: BC Managed Care – PPO | Admitting: Obstetrics and Gynecology

## 2021-04-15 ENCOUNTER — Other Ambulatory Visit: Payer: Self-pay

## 2021-04-15 ENCOUNTER — Encounter: Payer: Self-pay | Admitting: Obstetrics and Gynecology

## 2021-04-15 VITALS — BP 114/72 | Ht 68.0 in | Wt 210.0 lb

## 2021-04-15 DIAGNOSIS — N946 Dysmenorrhea, unspecified: Secondary | ICD-10-CM | POA: Diagnosis not present

## 2021-04-15 DIAGNOSIS — Z1231 Encounter for screening mammogram for malignant neoplasm of breast: Secondary | ICD-10-CM | POA: Diagnosis not present

## 2021-04-15 DIAGNOSIS — Z01419 Encounter for gynecological examination (general) (routine) without abnormal findings: Secondary | ICD-10-CM | POA: Diagnosis not present

## 2021-04-15 DIAGNOSIS — Z30431 Encounter for routine checking of intrauterine contraceptive device: Secondary | ICD-10-CM | POA: Diagnosis not present

## 2021-04-15 NOTE — Patient Instructions (Signed)
I value your feedback and you entrusting us with your care. If you get a [facility] patient survey, I would appreciate you taking the time to let us know about your experience today. Thank you! ? ? ?

## 2021-04-18 ENCOUNTER — Ambulatory Visit (INDEPENDENT_AMBULATORY_CARE_PROVIDER_SITE_OTHER): Payer: BC Managed Care – PPO

## 2021-04-18 ENCOUNTER — Other Ambulatory Visit: Payer: Self-pay

## 2021-04-18 ENCOUNTER — Telehealth: Payer: Self-pay | Admitting: Obstetrics and Gynecology

## 2021-04-18 DIAGNOSIS — Z30431 Encounter for routine checking of intrauterine contraceptive device: Secondary | ICD-10-CM | POA: Diagnosis not present

## 2021-04-18 DIAGNOSIS — N946 Dysmenorrhea, unspecified: Secondary | ICD-10-CM | POA: Diagnosis not present

## 2021-04-18 NOTE — Telephone Encounter (Signed)
[place]. Message sent through [facility].

## 2021-11-10 ENCOUNTER — Ambulatory Visit
Admission: RE | Admit: 2021-11-10 | Discharge: 2021-11-10 | Disposition: A | Discharge status: Home / Self Care | Payer: BC Managed Care – PPO | Source: Ambulatory Visit | Attending: Emergency Medicine | Admitting: Emergency Medicine

## 2021-11-10 VITALS — BP 111/60 | HR 64 | Temp 98.4°F | Resp 18

## 2021-11-10 DIAGNOSIS — L03115 Cellulitis of right lower limb: Secondary | ICD-10-CM | POA: Diagnosis not present

## 2021-11-10 DIAGNOSIS — L923 Foreign body granuloma of the skin and subcutaneous tissue: Secondary | ICD-10-CM

## 2021-11-10 MED ORDER — CEPHALEXIN 500 MG PO CAPS: 500.0000 mg | ORAL_CAPSULE | Freq: Three times a day (TID) | ORAL | 0 refills | Status: AC

## 2021-11-10 NOTE — Discharge Instructions (Addendum)
Take the antibiotic as directed.  Follow up with a dermatologist.

## 2021-11-10 NOTE — ED Triage Notes (Signed)
Patient presents to Urgent Care with complaints of rash x 2 weeks. Rash is located on her right leg on tattoo. She states tattoo artist believes may be related to allergic reaction or jeans rubbing against leg. Treating rash with Aquaphor and OTC ointment.   Denies fever.

## 2021-11-10 NOTE — ED Provider Notes (Signed)
[facility]    CSN: [id number] Arrival date & time: 11/10/21  1011      History   Chief Complaint Chief Complaint  Patient presents with   Rash    X 2 weeks     HPI Julia Graham is a 46 y.o. female.  Patient presents with redness and irritation on her right lower leg where she got a tattoo approximately 1 month ago.  No open wounds or drainage.  No fever, chills, or other symptoms.  Treating at home with Aquaphor  The history is provided by the patient.   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 Authorizing Provider  cephALEXin (KEFLEX) 500 MG capsule Take 1 capsule (500 mg total) by mouth 3 (three) times daily for 7 days. 11/10/21 11/17/21 Yes Sharion Balloon, NP  levonorgestrel (MIRENA) 20 MCG/24HR IUD 1 each by Intrauterine route once.    [provider]    Family History Family History  Problem Relation Age of Onset   Breast cancer Neg Hx     Social History Social History   Tobacco Use   Smoking status: Never   Smokeless tobacco: Never  Vaping Use   Vaping Use: Never used  Substance Use Topics   Alcohol use: Never   Drug use: Never     Allergies   Patient has no known allergies.   Review of Systems Review of Systems  Constitutional:  Negative for chills and fever.  Musculoskeletal:  Negative for arthralgias and joint swelling.  Skin:  Positive for color change and rash.  Neurological:  Negative for  weakness and numbness.  All other systems reviewed and are negative.   Physical Exam Triage Vital Signs ED Triage Vitals  Enc Vitals Group     BP 11/10/21 1026 111/60     Pulse Rate 11/10/21 1026 64     Resp 11/10/21 1026 18     Temp 11/10/21 1026 98.4 F (36.9 C)     Temp src --      SpO2 11/10/21 1026 98 %     Weight --      Height --      Head Circumference --      Peak Flow --      Pain Score 11/10/21 1027 0     Pain Loc --      Pain Edu? --      Excl. in [place]? --    No data found.  Updated Vital Signs BP 111/60   Pulse 64   Temp 98.4 F (36.9 C)   Resp 18   LMP 11/07/2021   SpO2 98%  Visual Acuity Right Eye Distance:   Left Eye Distance:   Bilateral Distance:    Right Eye Near:   Left Eye Near:    Bilateral Near:     Physical Exam Vitals and nursing note reviewed.  Constitutional:      General: She is not in acute distress.    Appearance: She is well-developed. She is not ill-appearing.  Cardiovascular:     Rate and Rhythm: Normal rate and regular rhythm.  Pulmonary:     Effort: Pulmonary effort is normal. No respiratory distress.  Musculoskeletal:     Cervical back: Neck supple.  Skin:    General: Skin is warm and dry.     Findings: Erythema present.     Comments: Large tattoo on right lower leg with localized erythema.  See picture.   Neurological:     Mental Status: She is alert.  Psychiatric:        Mood and Affect: Mood normal.        Behavior: Behavior normal.      UC Treatments / Results  Labs (all labs ordered are listed, but only abnormal results are displayed) Labs Reviewed - No data to display  EKG   Radiology No results found.  Procedures Procedures (including critical care time)  Medications Ordered in UC Medications - No data to display  Initial Impression / Assessment and Plan / UC Course  I have reviewed the triage vital signs and the nursing notes.  Pertinent labs & imaging results that were available during  my care of the patient were reviewed by me and considered in my medical decision making (see chart for details).    Cellulitis of right lower leg, tattoo reaction.  Afebrile and vital signs are stable.  No open wounds or drainage.  Treating with cephalexin.  Education provided on cellulitis.  Instructed patient to follow-up with a dermatologist.  She agrees to plan of care.  Final Clinical Impressions(s) / UC Diagnoses   Final diagnoses:  Cellulitis of right lower leg  Tattoo reaction     Discharge Instructions      Take the antibiotic as directed.  Follow up with a dermatologist.       ED Prescriptions     Medication Sig Dispense Auth. Provider   cephALEXin (KEFLEX) 500 MG capsule Take 1 capsule (500 mg total) by mouth 3 (three) times daily for 7 days. 21 capsule Sharion Balloon, NP      PDMP not reviewed this encounter.   Sharion Balloon, NP 11/10/21 1109

## 2022-07-01 ENCOUNTER — Other Ambulatory Visit: Payer: Self-pay | Admitting: Obstetrics and Gynecology

## 2022-07-01 ENCOUNTER — Encounter: Payer: Self-pay | Admitting: Obstetrics and Gynecology

## 2022-07-01 DIAGNOSIS — Z1231 Encounter for screening mammogram for malignant neoplasm of breast: Secondary | ICD-10-CM

## 2022-07-11 ENCOUNTER — Ambulatory Visit
Admission: RE | Admit: 2022-07-11 | Discharge: 2022-07-11 | Disposition: A | Discharge status: Home / Self Care | Payer: 59 | Source: Ambulatory Visit | Attending: Obstetrics and Gynecology | Admitting: Obstetrics and Gynecology

## 2022-07-11 DIAGNOSIS — Z1231 Encounter for screening mammogram for malignant neoplasm of breast: Secondary | ICD-10-CM | POA: Diagnosis present

## 2022-07-22 ENCOUNTER — Ambulatory Visit: Payer: Self-pay | Admitting: Obstetrics and Gynecology

## 2022-07-22 NOTE — Progress Notes (Unsigned)
PCP:  [place]   No chief complaint on file.    HPI:      [name] is a 47 y.o. [phone] whose LMP was No LMP recorded. (Menstrual status: IUD)., presents today for her annual examination.  Her menses are regular every 28-30 days, lasting 4 days, mod flow.  Dysmenorrhea mild, occurring first 1-2 days of flow. Has worse cramping pain before her period, question mid cycle. Takes tylenol/uses heating pad with sx relief. She does not have intermenstrual bleeding.   Sex activity: single partner, contraception - IUD. 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   Last mammogram: 07/11/22 Results were: normal--routine follow-up in 12 months There is no FH of breast cancer. There is no FH of ovarian cancer. The patient does do self-breast exams.  Tobacco use: The patient denies current or previous tobacco use. Alcohol use: none No drug use.  Exercise: moderately active  Colonoscopy: never  She does get adequate calcium and occas Vitamin D in her diet.  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    Past Surgical History:  Procedure Laterality Date   IUD REMOVAL  2014   WISDOM TOOTH EXTRACTION      Family History  Problem Relation Age of Onset   Breast cancer Neg Hx     Social History   Socioeconomic History   Marital status: Married    Spouse name: Not on file   Number of children: Not on file   Years of education: Not on file   Highest education level: Not on file  Occupational History   Not on file  Tobacco Use   Smoking status: Never   Smokeless tobacco: Never  Vaping Use   Vaping Use: Never used  Substance and Sexual Activity   Alcohol use: Never   Drug use: Never   Sexual activity: Yes    Partners: Male    Birth control/protection: I.U.D.    Comment: Mirena  Other Topics  Concern   Not on file  Social History Narrative   Not on file   Social Determinants of Health   Financial Resource Strain: Not on file  Food Insecurity: Not on file  Transportation Needs: Not on file  Physical Activity: Inactive (01/04/2018)   Exercise Vital Sign    Days of Exercise per Week: 0 days    Minutes of Exercise per Session: 0 min  Stress: Not on file  Social Connections: Not on file  Intimate Partner Violence: Not on file     Current Outpatient Medications:    levonorgestrel (MIRENA) 20 MCG/24HR IUD, 1 each by Intrauterine route once., Disp: , Rfl:      ROS:  Review of Systems  Constitutional:  Negative for fatigue, fever and unexpected weight change.  Respiratory:  Negative for cough, shortness of breath and wheezing.   Cardiovascular:  Negative for chest pain, palpitations and leg swelling.  Gastrointestinal:  Negative for blood in stool, constipation, diarrhea, nausea and vomiting.  Endocrine: Negative for cold intolerance, heat intolerance and polyuria.  Genitourinary:  Negative for dyspareunia, dysuria, flank pain, frequency, genital sores, hematuria, menstrual problem, pelvic pain, urgency, vaginal bleeding, vaginal discharge and vaginal pain.  Musculoskeletal:  Negative for back pain, joint swelling and myalgias.  Skin:  Negative for rash.  Neurological:  Negative for dizziness,  syncope, light-headedness, numbness and headaches.  Hematological:  Negative for adenopathy.  Psychiatric/Behavioral:  Negative for agitation, confusion, sleep disturbance and suicidal ideas. The patient is not nervous/anxious.    BREAST: No symptoms   Objective: There were no vitals taken for this visit.   Physical Exam Constitutional:      Appearance: She is well-developed.  Genitourinary:     Vulva normal.     Right Labia: No rash, tenderness or lesions.    Left Labia: No tenderness, lesions or rash.    No vaginal discharge, erythema or tenderness.      Right  Adnexa: not tender and no mass present.    Left Adnexa: not tender and no mass present.    No cervical friability or polyp.     IUD strings visualized.     Uterus is not enlarged or tender.  Breasts:    Right: No mass, nipple discharge, skin change or tenderness.     Left: No mass, nipple discharge, skin change or tenderness.  Neck:     Thyroid: No thyromegaly.  Cardiovascular:     Rate and Rhythm: Normal rate and regular rhythm.     Heart sounds: Normal heart sounds. No murmur heard. Pulmonary:     Effort: Pulmonary effort is normal.     Breath sounds: Normal breath sounds.  Abdominal:     Palpations: Abdomen is soft.     Tenderness: There is no abdominal tenderness. There is no guarding or rebound.  Musculoskeletal:        General: Normal range of motion.     Cervical back: Normal range of motion.  Lymphadenopathy:     Cervical: No cervical adenopathy.  Neurological:     General: No focal deficit present.     Mental Status: She is alert and oriented to person, place, and time.     Cranial Nerves: No cranial nerve deficit.  Skin:    General: Skin is warm and dry.  Psychiatric:        Mood and Affect: Mood normal.        Behavior: Behavior normal.        Thought Content: Thought content normal.        Judgment: Judgment normal.  Vitals reviewed.     Assessment/Plan: Encounter for annual routine gynecological examination  Encounter for routine checking of intrauterine contraceptive device (IUD) - Plan: US PELVIS TRANSVAGINAL NON-OB (TV ONLY); IUD string in cx os. Check IUD placement with GYN u/s due to worsening non-menstrual cramping. Will f/u with results.   Encounter for screening mammogram for malignant neoplasm of breast; pt current on mammo  Dysmenorrhea - Plan: US PELVIS TRANSVAGINAL NON-OB (TV ONLY)   GYN counsel breast self exam, mammography screening, adequate intake of calcium and vitamin D, diet and exercise     F/U  No follow-ups on file.  Jarom Govan B.  Andersen Iorio, PA-C 07/22/2022 1:31 PM

## 2022-07-24 ENCOUNTER — Ambulatory Visit (INDEPENDENT_AMBULATORY_CARE_PROVIDER_SITE_OTHER): Payer: 59 | Admitting: Obstetrics and Gynecology

## 2022-07-24 ENCOUNTER — Encounter: Payer: Self-pay | Admitting: Obstetrics and Gynecology

## 2022-07-24 ENCOUNTER — Other Ambulatory Visit (HOSPITAL_COMMUNITY)
Admission: RE | Admit: 2022-07-24 | Discharge: 2022-07-24 | Disposition: A | Discharge status: Home / Self Care | Payer: 59 | Source: Ambulatory Visit | Attending: Obstetrics and Gynecology | Admitting: Obstetrics and Gynecology

## 2022-07-24 VITALS — BP 97/62 | HR 62 | Resp 16 | Ht 68.0 in | Wt 223.8 lb

## 2022-07-24 DIAGNOSIS — Z6834 Body mass index (BMI) 34.0-34.9, adult: Secondary | ICD-10-CM

## 2022-07-24 DIAGNOSIS — Z124 Encounter for screening for malignant neoplasm of cervix: Secondary | ICD-10-CM | POA: Insufficient documentation

## 2022-07-24 DIAGNOSIS — Z0000 Encounter for general adult medical examination without abnormal findings: Secondary | ICD-10-CM

## 2022-07-24 DIAGNOSIS — Z1151 Encounter for screening for human papillomavirus (HPV): Secondary | ICD-10-CM | POA: Insufficient documentation

## 2022-07-24 DIAGNOSIS — Z131 Encounter for screening for diabetes mellitus: Secondary | ICD-10-CM

## 2022-07-24 DIAGNOSIS — Z1211 Encounter for screening for malignant neoplasm of colon: Secondary | ICD-10-CM

## 2022-07-24 DIAGNOSIS — Z01419 Encounter for gynecological examination (general) (routine) without abnormal findings: Secondary | ICD-10-CM

## 2022-07-24 DIAGNOSIS — Z1231 Encounter for screening mammogram for malignant neoplasm of breast: Secondary | ICD-10-CM

## 2022-07-24 DIAGNOSIS — Z13220 Encounter for screening for lipoid disorders: Secondary | ICD-10-CM

## 2022-07-24 DIAGNOSIS — Z30431 Encounter for routine checking of intrauterine contraceptive device: Secondary | ICD-10-CM

## 2022-07-24 NOTE — Patient Instructions (Signed)
I value your feedback and you entrusting us with your care. If you get a [facility] patient survey, I would appreciate you taking the time to let us know about your experience today. Thank you! ? ? ?

## 2022-07-28 LAB — CYTOLOGY - PAP
Comment: NEGATIVE
Diagnosis: NEGATIVE
High risk HPV: NEGATIVE

## 2022-07-29 ENCOUNTER — Telehealth: Payer: Self-pay | Admitting: Gastroenterology

## 2022-07-29 NOTE — Telephone Encounter (Signed)
Returned patients phone call.  Left voice message for her to call back to schedule.  Thanks, [place], [place]

## 2022-07-29 NOTE — Telephone Encounter (Signed)
Patient calling to schedule colonoscopy. Requesting call back in the morning.

## 2023-02-22 IMAGING — MG MM DIGITAL SCREENING BILAT W/ TOMO AND CAD
8 series · 8 of 24 positions shown · non-contrast
Comparison: Previous exam(s).

CLINICAL DATA: Screening.

EXAM:
DIGITAL SCREENING BILATERAL MAMMOGRAM WITH TOMOSYNTHESIS AND CAD
TECHNIQUE: Bilateral screening digital craniocaudal and mediolateral oblique
mammograms were obtained. Bilateral screening digital breast
tomosynthesis was performed. The images were evaluated with
computer-aided detection.

[R CC synth-2D]
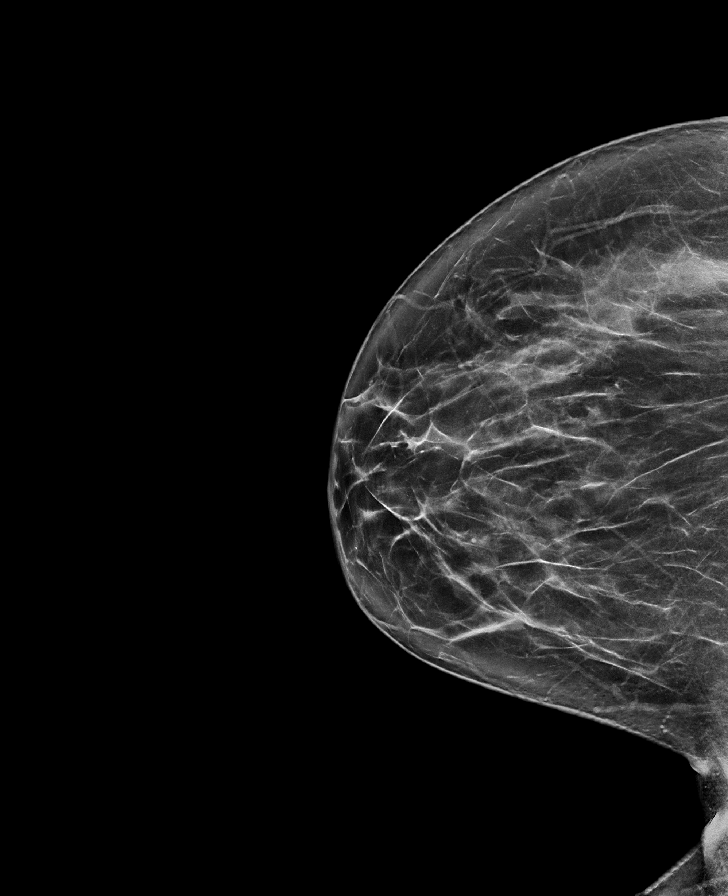

[R MLO synth-2D]
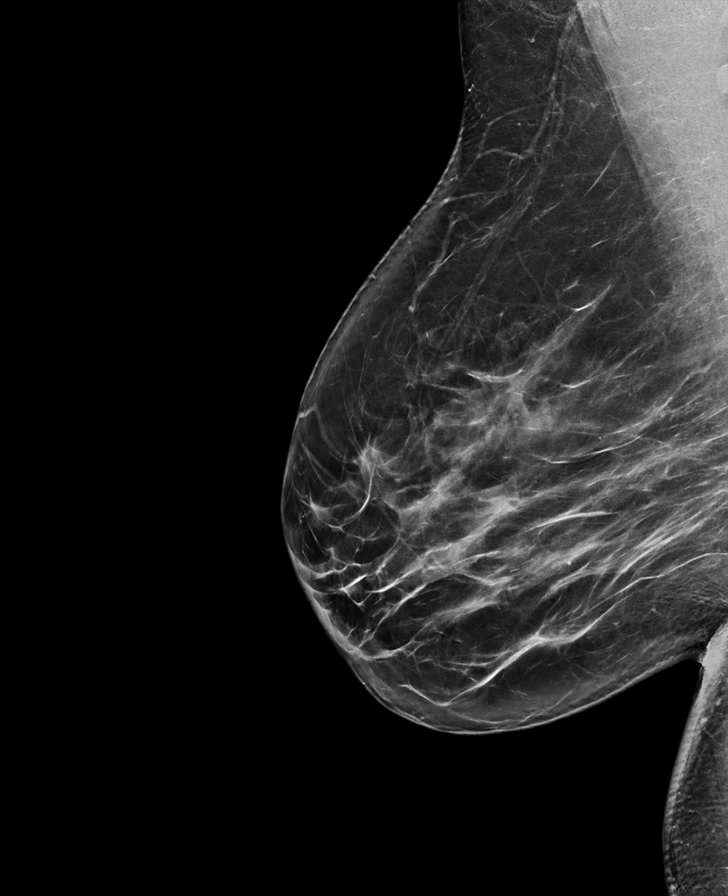

[L CC synth-2D]
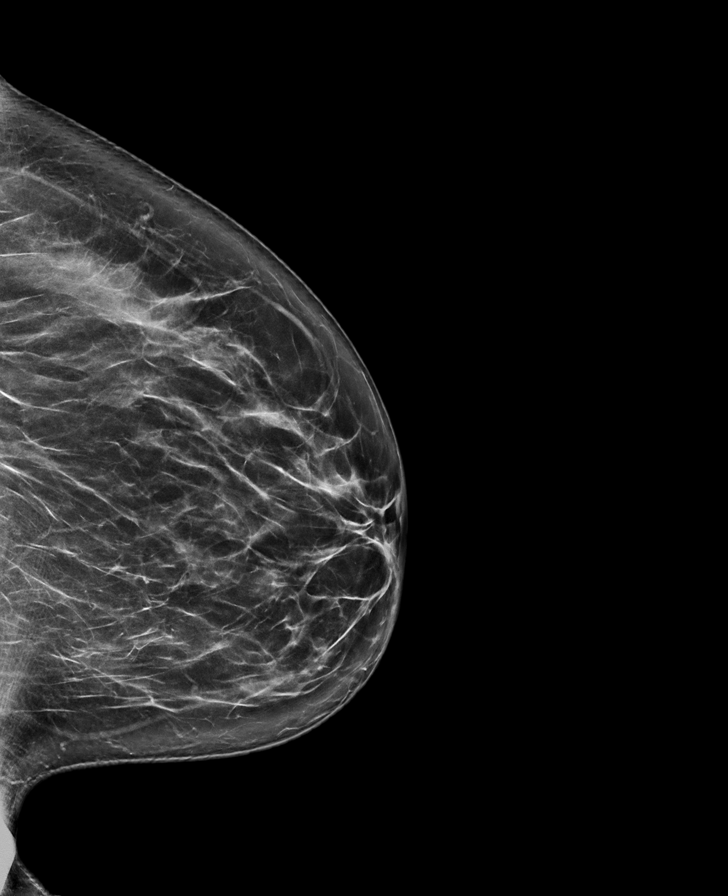

[L MLO synth-2D]
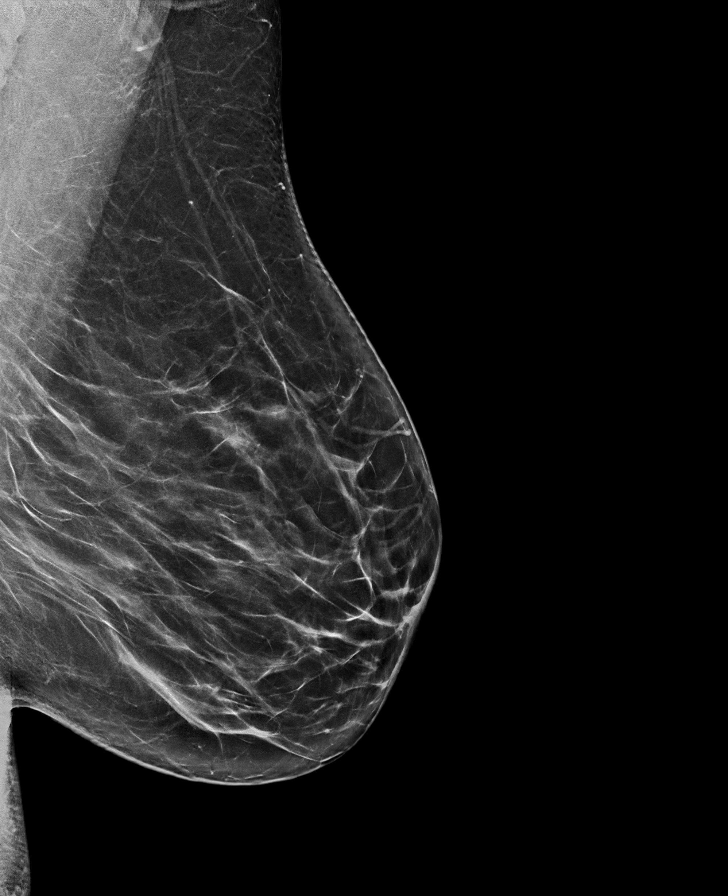

[R CC tomo · tomo slice 39/78.0]
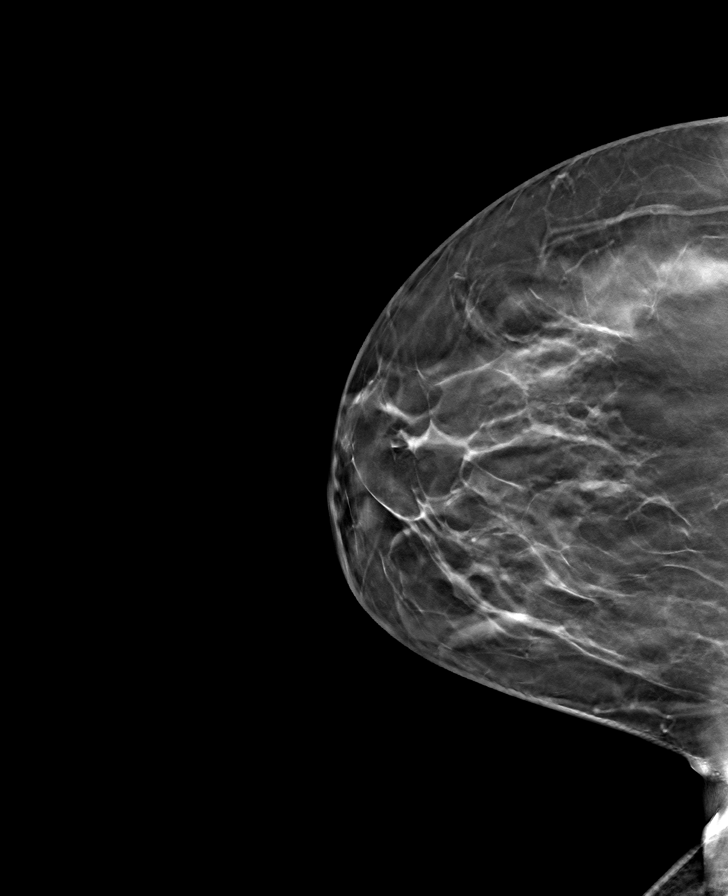

[L CC tomo · tomo slice 41/81.0]
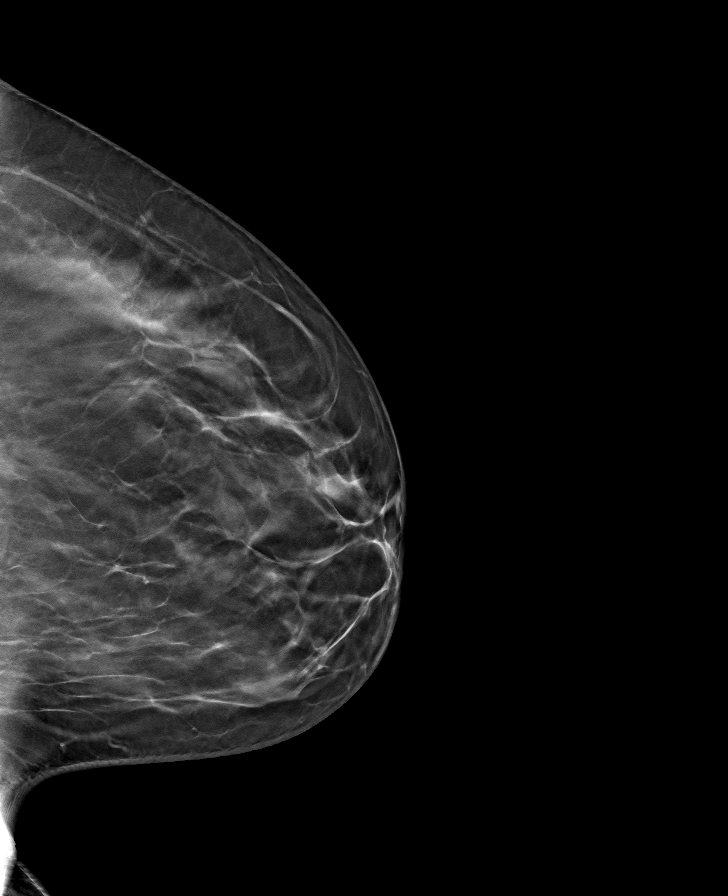

[R MLO tomo · tomo slice 43/84.0]
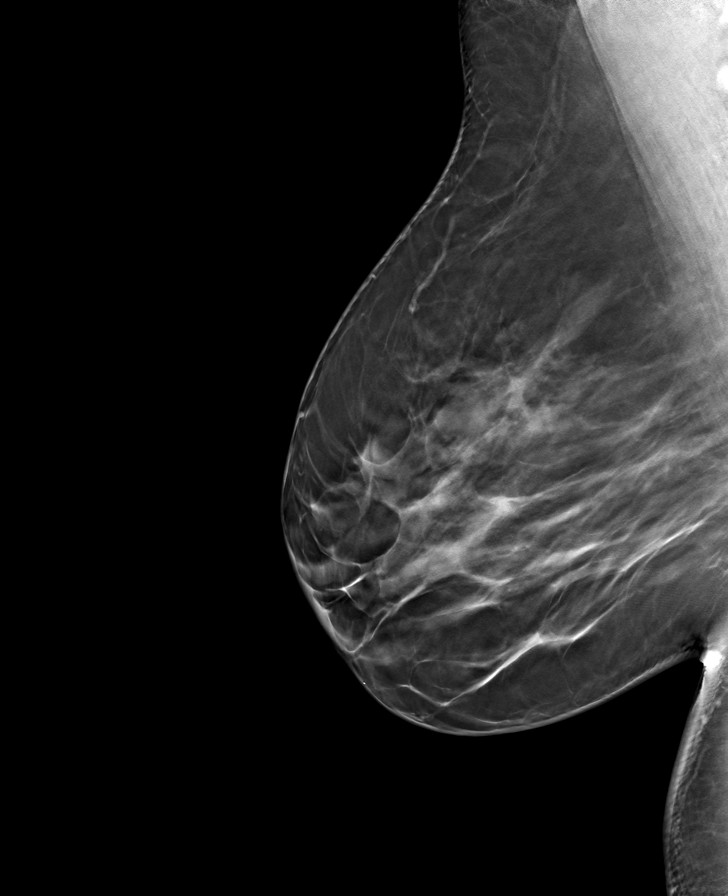

[L MLO tomo · tomo slice 41/82.0]
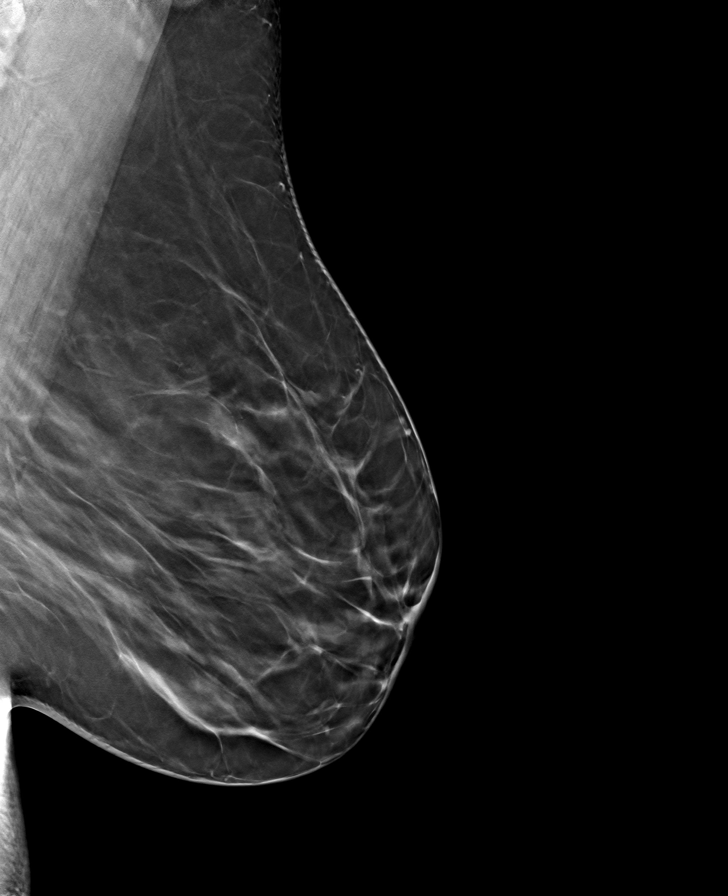

[8 of 24 positions shown; findings below may reference images not displayed]

ACR Breast Density Category c: The breast tissue is heterogeneously
dense, which may obscure small masses.
FINDINGS: There are no findings suspicious for malignancy.
IMPRESSION: No mammographic evidence of malignancy. A result letter of this
screening mammogram will be mailed directly to the patient.

RECOMMENDATION:
Screening mammogram in one year. (Code:Q3-W-BC3)

BI-RADS CATEGORY  1: Negative.

## 2023-08-24 ENCOUNTER — Other Ambulatory Visit: Payer: Self-pay | Admitting: Obstetrics and Gynecology

## 2023-08-24 ENCOUNTER — Encounter: Payer: Self-pay | Admitting: Obstetrics and Gynecology

## 2023-08-24 DIAGNOSIS — Z1231 Encounter for screening mammogram for malignant neoplasm of breast: Secondary | ICD-10-CM

## 2023-09-08 ENCOUNTER — Ambulatory Visit
Admission: RE | Admit: 2023-09-08 | Discharge: 2023-09-08 | Disposition: A | Discharge status: Home / Self Care | Source: Ambulatory Visit | Attending: Obstetrics and Gynecology | Admitting: Obstetrics and Gynecology

## 2023-09-08 DIAGNOSIS — Z1231 Encounter for screening mammogram for malignant neoplasm of breast: Secondary | ICD-10-CM | POA: Insufficient documentation

## 2023-09-10 ENCOUNTER — Encounter: Payer: Self-pay | Admitting: Obstetrics and Gynecology

## 2023-09-21 NOTE — Progress Notes (Unsigned)
 PCP:  [facility] [place]   No chief complaint on file.    HPI:      [name] is a 48 y.o. [phone] whose LMP was No LMP recorded. (Menstrual status: IUD)., presents today for her annual examination.  Her menses are regular every 28-30 days, lasting 3 days with IUD, light flow.  Dysmenorrhea mild, uses tylenol with sx relief. She does not have intermenstrual bleeding.   Sex activity: single partner, contraception - IUD. Mirena replaced 01/15/18. No pain/bleeding. 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   Last mammogram: 09/08/23  Results were: normal--routine follow-up in 12 months There is no FH of breast cancer. There is no FH of ovarian cancer. The patient does do self-breast exams.  Tobacco use: The patient denies current or previous tobacco use. Alcohol use: none No drug use.  Exercise: min active  Colonoscopy: never  She does get adequate calcium but not Vitamin D in her diet.  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    Past Surgical History:  Procedure Laterality Date   IUD REMOVAL  2014   WISDOM TOOTH EXTRACTION      Family History  Problem Relation Age of Onset   Breast cancer Neg Hx     Social History   Socioeconomic History   Marital status: Married    Spouse name: Not on file   Number of children: Not on file   Years of education: Not on file   Highest education level: Not on file  Occupational History   Not on file  Tobacco Use   Smoking status: Never   Smokeless tobacco: Never  Vaping Use   Vaping status: Never Used  Substance and Sexual Activity   Alcohol use: Never   Drug use: Never   Sexual activity: Yes    Partners: Male    Birth control/protection: I.U.D.    Comment: Mirena  Other Topics Concern   Not on file  Social History Narrative   Not on file   Social  Drivers of Health   Financial Resource Strain: Not on file  Food Insecurity: Not on file  Transportation Needs: Not on file  Physical Activity: Inactive (01/04/2018)   Exercise Vital Sign    Days of Exercise per Week: 0 days    Minutes of Exercise per Session: 0 min  Stress: Not on file  Social Connections: Not on file  Intimate Partner Violence: Not on file     Current Outpatient Medications:    levonorgestrel (MIRENA) 20 MCG/24HR IUD, 1 each by Intrauterine route once., Disp: , Rfl:      ROS:  Review of Systems  Constitutional:  Negative for fatigue, fever and unexpected weight change.  Respiratory:  Negative for cough, shortness of breath and wheezing.   Cardiovascular:  Negative for chest pain, palpitations and leg swelling.  Gastrointestinal:  Negative for blood in stool, constipation, diarrhea, nausea and vomiting.  Endocrine: Negative for cold intolerance, heat intolerance and polyuria.  Genitourinary:  Negative for dyspareunia, dysuria, flank pain, frequency, genital sores, hematuria, menstrual problem, pelvic pain, urgency, vaginal bleeding, vaginal discharge and vaginal pain.  Musculoskeletal:  Negative for back pain, joint swelling and myalgias.  Skin:  Negative for rash.  Neurological:  Negative for dizziness, syncope, light-headedness, numbness and headaches.  Hematological:  Negative for  adenopathy.  Psychiatric/Behavioral:  Negative for agitation, confusion, sleep disturbance and suicidal ideas. The patient is not nervous/anxious.    BREAST: No symptoms   Objective: There were no vitals taken for this visit.   Physical Exam Constitutional:      Appearance: She is well-developed.  Genitourinary:     Vulva normal.     Right Labia: No rash, tenderness or lesions.    Left Labia: No tenderness, lesions or rash.    No vaginal discharge, erythema or tenderness.      Right Adnexa: not tender and no mass present.    Left Adnexa: not tender and no mass  present.    No cervical friability or polyp.     IUD strings visualized.     Uterus is not enlarged or tender.  Breasts:    Right: No mass, nipple discharge, skin change or tenderness.     Left: No mass, nipple discharge, skin change or tenderness.  Neck:     Thyroid: No thyromegaly.  Cardiovascular:     Rate and Rhythm: Normal rate and regular rhythm.     Heart sounds: Normal heart sounds. No murmur heard. Pulmonary:     Effort: Pulmonary effort is normal.     Breath sounds: Normal breath sounds.  Abdominal:     Palpations: Abdomen is soft.     Tenderness: There is no abdominal tenderness. There is no guarding or rebound.  Musculoskeletal:        General: Normal range of motion.     Cervical back: Normal range of motion.  Lymphadenopathy:     Cervical: No cervical adenopathy.  Neurological:     General: No focal deficit present.     Mental Status: She is alert and oriented to person, place, and time.     Cranial Nerves: No cranial nerve deficit.  Skin:    General: Skin is warm and dry.  Psychiatric:        Mood and Affect: Mood normal.        Behavior: Behavior normal.        Thought Content: Thought content normal.        Judgment: Judgment normal.  Vitals reviewed.     Assessment/Plan: Encounter for annual routine gynecological examination  Cervical cancer screening - Plan: Cytology - PAP  Screening for HPV (human papillomavirus) - Plan: Cytology - PAP  Encounter for routine checking of intrauterine contraceptive device (IUD); has 8 yr indication  Encounter for screening mammogram for malignant neoplasm of breast; pt current on mammo  Screening for colon cancer - Plan: Ambulatory referral to Gastroenterology; refer to GI   Blood tests for routine general physical examination - Plan: Comprehensive metabolic panel, Lipid panel, Hemoglobin A1c  Screening cholesterol level - Plan: Lipid panel  Screening for diabetes mellitus - Plan: Hemoglobin A1c  BMI  34.0-34.9,adult - Plan: Comprehensive metabolic panel, Lipid panel, Hemoglobin A1c   GYN counsel breast self exam, mammography screening, adequate intake of calcium and vitamin D, diet and exercise     F/U  No follow-ups on file.  Preeti Winegardner B. Gita Dilger, PA-C 09/21/2023 8:57 PM

## 2023-09-22 ENCOUNTER — Encounter: Payer: Self-pay | Admitting: Obstetrics and Gynecology

## 2023-09-22 ENCOUNTER — Ambulatory Visit (INDEPENDENT_AMBULATORY_CARE_PROVIDER_SITE_OTHER): Admitting: Obstetrics and Gynecology

## 2023-09-22 VITALS — BP 100/63 | HR 61 | Ht 68.0 in | Wt 240.0 lb

## 2023-09-22 DIAGNOSIS — Z1211 Encounter for screening for malignant neoplasm of colon: Secondary | ICD-10-CM

## 2023-09-22 DIAGNOSIS — Z01419 Encounter for gynecological examination (general) (routine) without abnormal findings: Secondary | ICD-10-CM | POA: Diagnosis not present

## 2023-09-22 DIAGNOSIS — Z13220 Encounter for screening for lipoid disorders: Secondary | ICD-10-CM

## 2023-09-22 DIAGNOSIS — Z30431 Encounter for routine checking of intrauterine contraceptive device: Secondary | ICD-10-CM

## 2023-09-22 DIAGNOSIS — R635 Abnormal weight gain: Secondary | ICD-10-CM

## 2023-09-22 DIAGNOSIS — Z0000 Encounter for general adult medical examination without abnormal findings: Secondary | ICD-10-CM

## 2023-09-22 DIAGNOSIS — Z1329 Encounter for screening for other suspected endocrine disorder: Secondary | ICD-10-CM

## 2023-09-22 DIAGNOSIS — Z1231 Encounter for screening mammogram for malignant neoplasm of breast: Secondary | ICD-10-CM

## 2023-09-22 NOTE — Patient Instructions (Signed)
 I value your feedback and you entrusting [place] with your care. If you get a [place] patient survey, I would appreciate you taking the time to let us know about your experience today. Thank you! ? ? ?

## 2023-09-23 LAB — COMPREHENSIVE METABOLIC PANEL WITH GFR
ALT: 8 IU/L (ref 0–32)
AST: 13 IU/L (ref 0–40)
Albumin: 4 g/dL (ref 3.9–4.9)
Alkaline Phosphatase: 47 IU/L (ref 44–121)
BUN/Creatinine Ratio: 8 — ABNORMAL LOW (ref 9–23)
BUN: 7 mg/dL (ref 6–24)
Bilirubin Total: 0.3 mg/dL (ref 0.0–1.2)
CO2: 23 mmol/L (ref 20–29)
Calcium: 8.9 mg/dL (ref 8.7–10.2)
Chloride: 106 mmol/L (ref 96–106)
Creatinine, Ser: 0.86 mg/dL (ref 0.57–1.00)
Globulin, Total: 2.6 g/dL (ref 1.5–4.5)
Glucose: 97 mg/dL (ref 70–99)
Potassium: 4.2 mmol/L (ref 3.5–5.2)
Sodium: 140 mmol/L (ref 134–144)
Total Protein: 6.6 g/dL (ref 6.0–8.5)
eGFR: 84 mL/min/{1.73_m2} (ref >59)

## 2023-09-23 LAB — TSH+FREE T4
Free T4: 1 ng/dL (ref 0.82–1.77)
TSH: 0.984 u[IU]/mL (ref 0.450–4.500)

## 2023-09-23 LAB — LIPID PANEL
Chol/HDL Ratio: 3.3 ratio (ref 0.0–4.4)
Cholesterol, Total: 179 mg/dL (ref 100–199)
HDL: 54 mg/dL (ref >39)
LDL Chol Calc (NIH): 113 mg/dL — ABNORMAL HIGH (ref 0–99)
Triglycerides: 61 mg/dL (ref 0–149)
VLDL Cholesterol Cal: 12 mg/dL (ref 5–40)

## 2023-09-23 LAB — HEMOGLOBIN A1C
Est. average glucose Bld gHb Est-mCnc: 111 mg/dL
Hgb A1c MFr Bld: 5.5 % (ref 4.8–5.6)

## 2023-09-24 ENCOUNTER — Encounter: Payer: Self-pay | Admitting: Obstetrics and Gynecology

## 2023-09-28 ENCOUNTER — Telehealth: Payer: Self-pay | Admitting: *Deleted

## 2023-09-28 ENCOUNTER — Telehealth: Payer: Self-pay

## 2023-09-28 ENCOUNTER — Other Ambulatory Visit: Payer: Self-pay | Admitting: *Deleted

## 2023-09-28 DIAGNOSIS — Z1211 Encounter for screening for malignant neoplasm of colon: Secondary | ICD-10-CM

## 2023-09-28 MED ORDER — NA SULFATE-K SULFATE-MG SULF 17.5-3.13-1.6 GM/177ML PO SOLN
1.0000 | Freq: Once | ORAL | 0 refills | Status: AC
Start: 2023-09-28 — End: 2023-09-28

## 2023-09-28 NOTE — Telephone Encounter (Signed)
 The patient called in to schedule her colonoscopy.

## 2023-09-28 NOTE — Telephone Encounter (Signed)
 Colonoscopy schedule on 11/02/2023

## 2023-09-28 NOTE — Telephone Encounter (Signed)
 Gastroenterology Pre-Procedure Review  Request Date: 11/02/2023 Requesting Physician: Dr. Servando Snare  PATIENT REVIEW QUESTIONS: The patient responded to the following health history questions as indicated:    1. Are you having any GI issues? no 2. Do you have a personal history of Polyps? no 3. Do you have a family history of Colon Cancer or Polyps? no 4. Diabetes Mellitus? no 5. Joint replacements in the past 12 months?no 6. Major health problems in the past 3 months?no 7. Any artificial heart valves, MVP, or defibrillator?no    MEDICATIONS & ALLERGIES:    Patient reports the following regarding taking any anticoagulation/antiplatelet therapy:   Plavix, Coumadin, Eliquis, Xarelto, Lovenox, Pradaxa, Brilinta, or Effient? no Aspirin? no  Patient confirms/reports the following medications:  Current Outpatient Medications  Medication Sig Dispense Refill   levonorgestrel (MIRENA) 20 MCG/24HR IUD 1 each by Intrauterine route once.     No current facility-administered medications for this visit.    Patient confirms/reports the following allergies:  No Known Allergies  No orders of the defined types were placed in this encounter.   AUTHORIZATION INFORMATION Primary Insurance: 1D#: Group #:  Secondary Insurance: 1D#: Group #:  SCHEDULE INFORMATION: Date: 11/02/2023 Time: Location:  [facility]

## 2023-10-29 ENCOUNTER — Telehealth: Payer: Self-pay

## 2023-10-29 NOTE — Telephone Encounter (Signed)
 Patient called to reschedule her colonoscopy to 11/23/2023. I then called the endo unit and spoke with Vickie to change the date.

## 2023-11-23 ENCOUNTER — Ambulatory Visit: Admitting: Anesthesiology

## 2023-11-23 ENCOUNTER — Encounter
Admission: RE | Disposition: A | Discharge status: Home / Self Care | Payer: Self-pay | Source: Home / Self Care | Attending: Gastroenterology

## 2023-11-23 ENCOUNTER — Encounter: Payer: Self-pay | Admitting: Gastroenterology

## 2023-11-23 ENCOUNTER — Ambulatory Visit
Admission: RE | Admit: 2023-11-23 | Discharge: 2023-11-23 | Disposition: A | Discharge status: Home / Self Care | Attending: Gastroenterology | Admitting: Gastroenterology

## 2023-11-23 ENCOUNTER — Other Ambulatory Visit: Payer: Self-pay

## 2023-11-23 DIAGNOSIS — Z1211 Encounter for screening for malignant neoplasm of colon: Secondary | ICD-10-CM | POA: Insufficient documentation

## 2023-11-23 DIAGNOSIS — K5730 Diverticulosis of large intestine without perforation or abscess without bleeding: Secondary | ICD-10-CM | POA: Insufficient documentation

## 2023-11-23 DIAGNOSIS — E669 Obesity, unspecified: Secondary | ICD-10-CM | POA: Diagnosis not present

## 2023-11-23 DIAGNOSIS — Z6834 Body mass index (BMI) 34.0-34.9, adult: Secondary | ICD-10-CM | POA: Diagnosis not present

## 2023-11-23 HISTORY — PX: COLONOSCOPY: SHX5424

## 2023-11-23 SURGERY — COLONOSCOPY
Anesthesia: General

## 2023-11-23 MED ORDER — PROPOFOL 10 MG/ML IV BOLUS
INTRAVENOUS | Status: AC
  Filled 2023-11-23: qty 40

## 2023-11-23 MED ORDER — EPHEDRINE SULFATE-NACL 50-0.9 MG/10ML-% IV SOSY
PREFILLED_SYRINGE | INTRAVENOUS | Status: DC | PRN
  Administered 2023-11-23: 10 mg via INTRAVENOUS

## 2023-11-23 MED ORDER — SODIUM CHLORIDE 0.9 % IV SOLN: INTRAVENOUS | Status: DC

## 2023-11-23 MED ORDER — PROPOFOL 500 MG/50ML IV EMUL
INTRAVENOUS | Status: DC | PRN
  Administered 2023-11-23: 125 ug/kg/min via INTRAVENOUS

## 2023-11-23 MED ORDER — LIDOCAINE HCL (CARDIAC) PF 100 MG/5ML IV SOSY
PREFILLED_SYRINGE | INTRAVENOUS | Status: DC | PRN
  Administered 2023-11-23: 100 mg via INTRAVENOUS

## 2023-11-23 MED ORDER — LIDOCAINE HCL (PF) 2 % IJ SOLN
INTRAMUSCULAR | Status: AC
  Filled 2023-11-23: qty 5

## 2023-11-23 NOTE — Op Note (Signed)
 [facility] Gastroenterology Patient Name: Julia Graham Procedure Date: 11/23/2023 7:27 AM MRN: [id number] Account #: [id number] Date of Birth: 1976/04/29 Admit Type: Outpatient Age: 48 Room: [facility] ENDO ROOM 4 Gender: Female Note Status: Finalized Instrument Name: Hyman Main [id number] Procedure:             Colonoscopy Indications:           Screening for colorectal malignant neoplasm Providers:             Marnee Sink MD, MD Referring MD:          No Local Md, MD (Referring MD) Medicines:             Propofol per Anesthesia Complications:         No immediate complications. Procedure:             Pre-Anesthesia Assessment:                        - Prior to the procedure, a History and Physical was                         performed, and patient medications and allergies were                         reviewed. The patient's tolerance of previous                         anesthesia was also reviewed. The risks and benefits                         of the procedure and the sedation options and risks                         were discussed with the patient. All questions were                         answered, and informed consent was obtained. Prior                         Anticoagulants: The patient has taken no anticoagulant                         or antiplatelet agents. ASA Grade Assessment: II - A                         patient with mild systemic disease. After reviewing                         the risks and benefits, the patient was deemed in                         satisfactory condition to undergo the procedure.                        After obtaining informed consent, the colonoscope was                         passed under direct vision. Throughout the procedure,  the patient's blood pressure, pulse, and oxygen                         saturations were monitored continuously. The                         Colonoscope was introduced through  the anus and                         advanced to the the cecum, identified by appendiceal                         orifice and ileocecal valve. The colonoscopy was                         performed without difficulty. The patient tolerated                         the procedure well. The quality of the bowel                         preparation was excellent. Findings:      The perianal and digital rectal examinations were normal.      A few small-mouthed diverticula were found in the sigmoid colon. Impression:            - Diverticulosis in the sigmoid colon.                        - No specimens collected. Recommendation:        - Discharge patient to home.                        - Resume previous diet.                        - Continue present medications.                        - Repeat colonoscopy in 10 years for screening                         purposes. Procedure Code(s):     --- Professional ---                        [phone], Colonoscopy, flexible; diagnostic, including                         collection of specimen(s) by brushing or washing, when                         performed (separate procedure) Diagnosis Code(s):     --- Professional ---                        Z12.11, Encounter for screening for malignant neoplasm                         of colon CPT copyright 2022 American Medical Association. All rights reserved. The codes documented in this report are preliminary and upon coder review may  be revised to meet current compliance requirements. Marnee Sink MD, MD 11/23/2023 8:13:24 AM This report has been signed electronically. Number of Addenda: 0 Note Initiated On: 11/23/2023 7:27 AM Scope Withdrawal Time: 0 hours 7 minutes 1 second  Total Procedure Duration: 0 hours 11 minutes 48 seconds  Estimated Blood Loss:  Estimated blood loss: none.      [facility]

## 2023-11-23 NOTE — Anesthesia Preprocedure Evaluation (Signed)
 Anesthesia Evaluation  Patient identified by MRN, date of birth, ID band Patient awake    Reviewed: Allergy & Precautions, H&P , NPO status , Patient's Chart, lab work & pertinent test results, reviewed documented beta blocker date and time   History of Anesthesia Complications Negative for: history of anesthetic complications  Airway Mallampati: II  TM Distance: >3 FB Neck ROM: full    Dental  (+) Dental Advidsory Given   Pulmonary neg pulmonary ROS   Pulmonary exam normal breath sounds clear to auscultation       Cardiovascular Exercise Tolerance: Good negative cardio ROS Normal cardiovascular exam Rhythm:regular Rate:Normal     Neuro/Psych negative neurological ROS  negative psych ROS   GI/Hepatic negative GI ROS, Neg liver ROS,,,  Endo/Other  negative endocrine ROS    Renal/GU negative Renal ROS  negative genitourinary   Musculoskeletal   Abdominal   Peds  Hematology negative hematology ROS (+)   Anesthesia Other Findings Past Medical History: 05/2014: Genital herpes     Comment:  type 1 on culture Obesity  Reproductive/Obstetrics negative OB ROS                             Anesthesia Physical Anesthesia Plan  ASA: 2  Anesthesia Plan: General   Post-op Pain Management:    Induction: Intravenous  PONV Risk Score and Plan: 3 and Propofol infusion, TIVA and Treatment may vary due to age or medical condition  Airway Management Planned: Natural Airway and Nasal Cannula  Additional Equipment:   Intra-op Plan:   Post-operative Plan:   Informed Consent: I have reviewed the patients History and Physical, chart, labs and discussed the procedure including the risks, benefits and alternatives for the proposed anesthesia with the patient or authorized representative who has indicated his/her understanding and acceptance.     Dental Advisory Given  Plan Discussed with:  Anesthesiologist, CRNA and Surgeon  Anesthesia Plan Comments:        Anesthesia Quick Evaluation

## 2023-11-23 NOTE — Transfer of Care (Signed)
 Immediate Anesthesia Transfer of Care Note  Patient: Julia Graham  Procedure(s) Performed: COLONOSCOPY  Patient Location: PACU  Anesthesia Type:MAC  Level of Consciousness: awake, alert , and oriented  Airway & Oxygen Therapy: Patient Spontanous Breathing  Post-op Assessment: Report given to RN and Post -op Vital signs reviewed and stable  Post vital signs: stable  Last Vitals:  Vitals Value Taken Time  BP 85/51 11/23/23 0815  Temp 36.1 C 11/23/23 0815  Pulse 83 11/23/23 0815  Resp 15 11/23/23 0815  SpO2 100 % 11/23/23 0815    Last Pain:  Vitals:   11/23/23 0815  TempSrc: Temporal  PainSc: 0-No pain         Complications: No notable events documented.

## 2023-11-23 NOTE — H&P (Signed)
   Marnee Sink, MD [facility] [address] [place], [place] [address] Phone: [phone] Fax : [phone]  Primary Care Physician:  Pcp, No Primary Gastroenterologist:  Dr. Ole Berkeley  Pre-Procedure History & Physical: HPI:  Julia Graham is a 48 y.o. female is here for a screening colonoscopy.   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 Authorizing Provider  levonorgestrel  (MIRENA ) 20 MCG/24HR IUD 1 each by Intrauterine route once.    [provider]    Allergies as of 09/29/2023   (No Known Allergies)    Family History  Problem Relation Age of Onset   Breast cancer Neg Hx     Social History   Socioeconomic History   Marital status: Married    Spouse name: Not on file   Number of children: Not on file   Years of education: Not on file   Highest education level: Not on file  Occupational History   Not on file  Tobacco Use   Smoking status: Never   Smokeless tobacco: Never  Vaping Use   Vaping status: Never Used  Substance and Sexual Activity   Alcohol use: Never   Drug use: Never   Sexual activity: Yes    Partners: Male    Birth control/protection: I.U.D.    Comment: Mirena   Other Topics Concern   Not on file  Social History Narrative   Not on file   Social Drivers of Health   Financial Resource Strain: Not on file  Food Insecurity: Not on file  Transportation Needs: Not on file  Physical Activity: Inactive (01/04/2018)   Exercise Vital Sign    Days of Exercise per Week: 0 days    Minutes of Exercise per Session: 0 min  Stress: Not on file  Social Connections: Not on file  Intimate Partner Violence: Not on file    Review of Systems: See HPI, otherwise negative ROS  Physical Exam: BP (!) 114/59   Pulse  69   Temp (!) 96.7 F (35.9 C) (Temporal)   Resp 18   Ht 5\' 8"  (1.727 m)   Wt 103.4 kg   SpO2 99%   BMI 34.67 kg/m  General:   Alert,  pleasant and cooperative in NAD Head:  Normocephalic and atraumatic. Neck:  Supple; no masses or thyromegaly. Lungs:  Clear throughout to auscultation.    Heart:  Regular rate and rhythm. Abdomen:  Soft, nontender and nondistended. Normal bowel sounds, without guarding, and without rebound.   Neurologic:  Alert and  oriented x4;  grossly normal neurologically.  Impression/Plan: Julia Graham is now here to undergo a screening colonoscopy.  Risks, benefits, and alternatives regarding colonoscopy have been reviewed with the patient.  Questions have been answered.  All parties agreeable.

## 2023-11-27 NOTE — Anesthesia Postprocedure Evaluation (Signed)
 Anesthesia Post Note  Patient: Julia Graham  Procedure(s) Performed: COLONOSCOPY  Patient location during evaluation: Endoscopy Anesthesia Type: General Level of consciousness: awake and alert Pain management: pain level controlled Vital Signs Assessment: post-procedure vital signs reviewed and stable Respiratory status: spontaneous breathing, nonlabored ventilation, respiratory function stable and patient connected to nasal cannula oxygen Cardiovascular status: blood pressure returned to baseline and stable Postop Assessment: no apparent nausea or vomiting Anesthetic complications: no   No notable events documented.   Last Vitals:  Vitals:   11/23/23 0825 11/23/23 0830  BP: 115/78 103/74  Pulse: 63 65  Resp: 15 20  Temp:    SpO2: 100% 100%    Last Pain:  Vitals:   11/23/23 0830  TempSrc:   PainSc: 0-No pain                 Vanice Genre
